# Patient Record
Sex: Male | Born: 1955 | Race: White | Hispanic: No | Marital: Married | Smoking: Former smoker
Health system: Southern US, Community
[De-identification: ages and names within clinical notes are randomized; demographics above are authoritative.]

## PROBLEM LIST (undated history)

## (undated) DIAGNOSIS — K4090 Unilateral inguinal hernia, without obstruction or gangrene, not specified as recurrent: Secondary | ICD-10-CM

## (undated) DIAGNOSIS — R972 Elevated prostate specific antigen [PSA]: Secondary | ICD-10-CM

## (undated) DIAGNOSIS — A6002 Herpesviral infection of other male genital organs: Secondary | ICD-10-CM

## (undated) DIAGNOSIS — N433 Hydrocele, unspecified: Secondary | ICD-10-CM

## (undated) DIAGNOSIS — F32A Depression, unspecified: Secondary | ICD-10-CM

## (undated) HISTORY — PX: PROSTATE BIOPSY: SHX241

## (undated) HISTORY — PX: NO PAST SURGERIES: SHX2092

## (undated) HISTORY — PX: OTHER SURGICAL HISTORY: SHX169

## (undated) HISTORY — DX: Herpesviral infection of other male genital organs: A60.02

---

## 2015-07-15 ENCOUNTER — Ambulatory Visit (INDEPENDENT_AMBULATORY_CARE_PROVIDER_SITE_OTHER): Payer: BLUE CROSS/BLUE SHIELD | Admitting: Urology

## 2015-07-15 ENCOUNTER — Telehealth: Payer: Self-pay | Admitting: Surgery

## 2015-07-15 ENCOUNTER — Encounter: Payer: Self-pay | Admitting: Urology

## 2015-07-15 VITALS — BP 133/77 | HR 79 | Ht 72.0 in | Wt 205.0 lb

## 2015-07-15 DIAGNOSIS — K4090 Unilateral inguinal hernia, without obstruction or gangrene, not specified as recurrent: Secondary | ICD-10-CM | POA: Diagnosis not present

## 2015-07-15 DIAGNOSIS — N50819 Testicular pain, unspecified: Secondary | ICD-10-CM

## 2015-07-15 DIAGNOSIS — N4340 Spermatocele of epididymis, unspecified: Secondary | ICD-10-CM

## 2015-07-15 NOTE — Progress Notes (Signed)
07/15/2015 2:16 PM   Dan Li June 20, 1955 [id number]  Referring provider: Rusty Aus, MD [facility] [place], [place] [address]  Chief Complaint  Patient presents with  . Testicle Pain    New Patient    HPI: 60 year old male referred for further evaluation of right testicular discomfort and enlargement. The patient reports that he is had a long history of right scrotal enlargement which may have gotten larger over the past year.  This was noted by his PCP on physical exam and referred for further evaluation. He denies any severe testicular or scrotal pain rather a dull aching in this area at times.   His primary complaint is right groin pain which bothers him on occasion for a day or 2 at a time and then resolves. This tends to be one is more active or lifting. 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  He does also have some chronic mid back pain for which she takes Advil.  He denies any testicular trauma or groin injuries. No testicular masses.  No weight loss or night sweats.  Denies any issues voiding. No gross hematuria, dysuria, or UTIs.  No scrotal or pelvic imaging.   PMH: Past Medical History  Diagnosis Date  . [name]     Surgical History: Past Surgical History  Procedure Laterality Date  . None      Home Medications:    Medication List    Notice  As of 07/15/2015 11:59 PM   You have not been prescribed any medications.      Allergies:  Allergies  Allergen Reactions  . Thorazine [Chlorpromazine]     tongue swelling    Family History: Family History  Problem Relation Age of Onset  . Bladder Cancer Neg Hx   . Prostate cancer Neg Hx   . Kidney cancer Neg Hx     Social History:  reports that he has quit smoking. He does not have any smokeless tobacco history on file. He reports that he drinks alcohol. He reports that he does not use illicit  drugs.  ROS: UROLOGY Frequent Urination?: No Hard to postpone urination?: No Burning/pain with urination?: No Get up at night to urinate?: No Leakage of urine?: No Urine stream starts and stops?: No Trouble starting stream?: No Do you have to strain to urinate?: No Blood in urine?: No Urinary tract infection?: No Sexually transmitted disease?: Yes Injury to kidneys or bladder?: No Painful intercourse?: No Weak stream?: No Erection problems?: No Penile pain?: No  Gastrointestinal Nausea?: No Vomiting?: No Indigestion/heartburn?: No Diarrhea?: No Constipation?: No  Constitutional Fever: No Night sweats?: No Weight loss?: No Fatigue?: No  Skin Skin rash/lesions?: No Itching?: No  Eyes Blurred vision?: No Double vision?: No  Ears/Nose/Throat Sore throat?: No Sinus problems?: No  Hematologic/Lymphatic Swollen glands?: No Easy bruising?: No  Cardiovascular Leg swelling?: No Chest pain?: No  Respiratory Cough?: No Shortness of breath?: No  Endocrine Excessive thirst?: No  Musculoskeletal Back pain?: Yes Joint pain?: No  Neurological Headaches?: No Dizziness?: No  Psychologic Depression?: No Anxiety?: No  Physical Exam: BP 133/77 mmHg  Pulse 79  Ht 6' (1.829 m)  Wt 205 lb (92.987 kg)  BMI 27.80 kg/m2  Constitutional:  Alert and oriented, No acute distress. HEENT: [place] AT, moist mucus membranes.  Trachea midline, no masses. Cardiovascular: No clubbing, cyanosis, or edema. Respiratory: Normal respiratory effort, no increased work of breathing. GI: Abdomen  is soft, nontender, nondistended, no abdominal masses.  Bulging in the right inguinal area and noted with Valsalva consistent with possible right inguinal hernia. This is not palpable on the left side and markedly different. GU: No CVA tenderness.  Normal orthotopic urethral meatus which is patent, normal phallus. Bilateral testicles nontender without masses. Right approximately 3 cm  spermatocele palpable just above the right testicle but distinctly separate.  No cord tenderness bilaterally. Mild fullness of right scrotum consistent with very small hydrocele. No scrotal swelling or erythema. Skin: No rashes, bruises or suspicious lesions. Lymph: No cervical or inguinal adenopathy. Neurologic: Grossly intact, no focal deficits, moving all 4 extremities. Psychiatric: Normal mood and affect.  Laboratory Data: Comprehensive Metabolic Panel (CMP) (73/71/0626 9:51 AM) Comprehensive Metabolic Panel (CMP) (94/85/4627 9:51 AM)  Component Value Ref Range  Glucose 97 70 - 110 mg/dL  Sodium 142 136 - 145 mmol/L  Potassium 4.1 3.6 - 5.1 mmol/L  Chloride 106 97 - 109 mmol/L  Carbon Dioxide (CO2) 27.9 22.0 - 32.0 mmol/L  Urea Nitrogen (BUN) 18 7 - 25 mg/dL  Creatinine 1.3 0.7 - 1.3 mg/dL  Glomerular Filtration Rate (eGFR), MDRD Estimate 56 (L) >60 mL/min/1.73sq m   PSA, Total (Screen) (07/06/2015 9:51 AM) PSA, Total (Screen) (07/06/2015 9:51 AM)  Component Value Ref Range  PSA (Prostate Specific Antigen), Total 0.97 0.10 - 4.00 ng/mL   CBC w/auto Differential (5 Part) (07/06/2015 9:51 AM) CBC w/auto Differential (5 Part) (07/06/2015 9:51 AM)  Component Value Ref Range  WBC (White Blood Cell Count) 3.9 (L) 4.1 - 10.2 10^3/uL  RBC (Red Blood Cell Count) 5.28 4.69 - 6.13 10^6/uL  Hemoglobin 14.6 14.1 - 18.1 gm/dL  Hematocrit 43.7 40.0 - 52.0 %  MCV (Mean Corpuscular Volume) 82.8 80.0 - 100.0 fl  MCH (Mean Corpuscular Hemoglobin) 27.7 27.0 - 31.2 pg  MCHC (Mean Corpuscular Hemoglobin Concentration) 33.4 32.0 - 36.0 gm/dL  Platelet Count 135 (L) 150 - 450 10^3/uL     Urinalysis Urinalysis w/Microscopic (07/06/2015 9:51 AM) Urinalysis w/Microscopic (07/06/2015 9:51 AM)  Component Value Ref Range  Color Yellow Yellow, Straw  Clarity Clear Clear  Specific Gravity 1.010 1.000 - 1.030  pH, Urine 6.5 5.0 - 8.0  Protein, Urinalysis Negative Negative, Trace mg/dL  Glucose,  Urinalysis Negative Negative mg/dL  Ketones, Urinalysis Negative Negative mg/dL  Blood, Urinalysis Negative Negative  Nitrite, Urinalysis Negative Negative  Leukocyte Esterase, Urinalysis Negative Negative  White Blood Cells, Urinalysis None Seen None Seen, 0-3 /hpf  Red Blood Cells, Urinalysis None Seen None Seen, 0-3 /hpf  Bacteria, Urinalysis None Seen None Seen /hpf  Squamous Epithelial Cells, Urinalysis None Seen Rare, Few, None Seen /hpf    Pertinent Imaging: n/a*  Assessment & Plan:   1. Testicle pain Moderate sized right spermatocele noted on exam today. Will obtain scrotal ultrasound to confirm this diagnosis.  I explained that this is benign and does not necessarily need surgical excision unless it bothering him.  I offered him surgical excision of his right spermatocele and discussed the procedure itself today at length. He may also have a right inguinal hernia and if he will be having surgery for this, I explained that I am happy to arrange for a combined procedure. All of his questions were answered. - Urinalysis, Complete - US Scrotum; Future - [place] Art/Ven Flow Abd Pelv Doppler; Future   2. Right inguinal hernia Suspect right inguinal hernia palpable today on exam. I will refer him to general surgery for further evaluation. - Ambulatory referral to  General Surgery  3. Spermatocele As above  Return if symptoms worsen or fail to improve, for will call with scrotal ultrasound result --> possible surgery.  Hollice Espy, MD  [facility] [address], [place], [place] [address] [phone]

## 2015-07-15 NOTE — Telephone Encounter (Signed)
I have called patient to make an appointment with general surgery for a right inguinal hernia. No answer. I have left a message on voicemail for patient to call us back to make an appointment.

## 2015-07-16 LAB — URINALYSIS, COMPLETE
BILIRUBIN UA: NEGATIVE
GLUCOSE, UA: NEGATIVE
KETONES UA: NEGATIVE
Leukocytes, UA: NEGATIVE
NITRITE UA: NEGATIVE
Protein, UA: NEGATIVE
SPEC GRAV UA: 1.02 (ref 1.005–1.030)
UUROB: 0.2 mg/dL (ref 0.2–1.0)
pH, UA: 7 (ref 5.0–7.5)

## 2015-07-16 LAB — MICROSCOPIC EXAMINATION
Bacteria, UA: NONE SEEN
Epithelial Cells (non renal): NONE SEEN /hpf (ref 0–10)

## 2015-07-17 ENCOUNTER — Encounter: Payer: Self-pay | Admitting: Urology

## 2015-07-21 NOTE — Telephone Encounter (Signed)
We are unable to contact patient--i have mailed the patient a letter to contact our office to make appointment.

## 2015-07-25 ENCOUNTER — Ambulatory Visit: Payer: BLUE CROSS/BLUE SHIELD

## 2019-01-13 ENCOUNTER — Ambulatory Visit: Payer: BLUE CROSS/BLUE SHIELD | Admitting: Urology

## 2019-01-20 ENCOUNTER — Encounter: Payer: Self-pay | Admitting: Urology

## 2019-01-20 ENCOUNTER — Other Ambulatory Visit: Payer: Self-pay

## 2019-01-20 ENCOUNTER — Ambulatory Visit: Payer: BLUE CROSS/BLUE SHIELD | Admitting: Urology

## 2019-01-20 DIAGNOSIS — K4090 Unilateral inguinal hernia, without obstruction or gangrene, not specified as recurrent: Secondary | ICD-10-CM | POA: Diagnosis not present

## 2019-01-20 DIAGNOSIS — N4341 Spermatocele of epididymis, single: Secondary | ICD-10-CM

## 2019-01-20 LAB — URINALYSIS, COMPLETE
Bilirubin, UA: NEGATIVE
Glucose, UA: NEGATIVE
Ketones, UA: NEGATIVE
Leukocytes,UA: NEGATIVE
Nitrite, UA: NEGATIVE
Protein,UA: NEGATIVE
Specific Gravity, UA: 1.025 (ref 1.005–1.030)
Urobilinogen, Ur: 0.2 mg/dL (ref 0.2–1.0)
pH, UA: 6.5 (ref 5.0–7.5)

## 2019-01-20 LAB — MICROSCOPIC EXAMINATION
Bacteria, UA: NONE SEEN
Epithelial Cells (non renal): NONE SEEN /hpf (ref 0–10)
WBC, UA: NONE SEEN /hpf (ref 0–5)

## 2019-01-20 NOTE — Progress Notes (Signed)
01/20/2019 11:06 AM   Dan Li 1955/08/01 [id number]  Referring provider: [name], MD [phone] [facility] [address] [facility] [place],  [place] [address]  Chief Complaint  Patient presents with  . Testicle Pain    HPI: 63 year old male with a personal history of a right spermatocele seen and evaluated 3+ years ago he returns today with the same complaint.  At that time, he was having intermittent symptoms from right epididymal cyst/spermatocele based on physical exam findings.  There is also concern for a inguinal hernia for which she was referred to general surgery but never pursued this evaluation.  He was essentially lost to follow-up.  He reports today, over the past several years, he has had some mild enlargement in his right hemiscrotum.  He reports discomfort in his underwear, heaviness in his scrotum which can be bothersome to him at times.  No severe overt pain.  No urinary symptoms.  At the time of previous presentation, he was having inguinal pain and discomfort but reports that he is avoided heavy lifting and thus this pain has almost completely resolved.   PMH: Past Medical History:  Diagnosis Date  . 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 NO PAST SURGERIES      Home Medications:  Allergies as of 01/20/2019      Reactions   Thorazine [chlorpromazine]    tongue swelling      Medication List    as of January 20, 2019 11:06 AM   You have not been prescribed any medications.     Allergies:  Allergies  Allergen Reactions  . Thorazine [Chlorpromazine]     tongue swelling    Family History: Family History  Problem Relation Age of Onset  . Bladder Cancer Neg Hx   . Prostate cancer Neg Hx   . Kidney cancer Neg Hx     Social History:  reports that he has quit smoking. He has never used smokeless tobacco. He reports current alcohol use. He reports that he does  not use drugs.  ROS: UROLOGY Frequent Urination?: No Hard to postpone urination?: No Burning/pain with urination?: No Get up at night to urinate?: No Leakage of urine?: No Urine stream starts and stops?: No Trouble starting stream?: No Do you have to strain to urinate?: No Blood in urine?: No Urinary tract infection?: No Sexually transmitted disease?: No Injury to kidneys or bladder?: No Painful intercourse?: No Weak stream?: No Erection problems?: No Penile pain?: No  Gastrointestinal Nausea?: No Vomiting?: No Indigestion/heartburn?: No Diarrhea?: No Constipation?: No  Constitutional Fever: No Night sweats?: No Weight loss?: No Fatigue?: No  Skin Skin rash/lesions?: No Itching?: No  Eyes Blurred vision?: No Double vision?: No  Ears/Nose/Throat Sore throat?: No Sinus problems?: No  Hematologic/Lymphatic Swollen glands?: No Easy bruising?: No  Cardiovascular Leg swelling?: No Chest pain?: No  Respiratory Cough?: No Shortness of breath?: No  Endocrine Excessive thirst?: No  Musculoskeletal Back pain?: No Joint pain?: No  Neurological Headaches?: No Dizziness?: No  Psychologic Depression?: No Anxiety?: No  Physical Exam: BP (!) 150/82   Pulse 71   Ht 6' (1.829 m)   Wt 200 lb (90.7 kg)   BMI 27.12 kg/m   Constitutional:  Alert and oriented, No acute distress. HEENT: [place] AT, moist mucus membranes.  Trachea midline, no masses. Cardiovascular: No clubbing, cyanosis, or edema. Respiratory: Normal respiratory effort, no increased work of breathing. GI: Abdomen is  soft, nontender, nondistended, no abdominal masses, fullness in right inguinal area with Valsalva GU: Full right hemiscrotum with a barely palpable right testicle, presumably related to small hydrocele, large cystic structure approximately lemon size overlying right testicle which is discrete as well as bulky tissue like feel within the inguinal canal.  Uncircumcised phallus with  easily retractable foreskin.  Left hemiscrotum and testicle normal. Lymph: No cervical or inguinal lymphadenopathy. Skin: No rashes, bruises or suspicious lesions. Neurologic: Grossly intact, no focal deficits, moving all 4 extremities. Psychiatric: Normal mood and affect.  Laboratory Data: nA   Pertinent Imaging: none  Assessment & Plan:    1. Spermatocele of epididymis, single Right scrotal fullness with likely multiple pathologies including likely enlarging right spermatocele, possible small hydrocele, and exam highly suspicious for right inguinal hernia as per previous evaluation.  I recommended a scrotal ultrasound for more clear anatomic evaluation to differentiate structures and pathology.  If he does not fact have a right inguinal hernia, he would likely benefit from repair as he has been symptomatic from this in the past.  Spermatocele versus hydrocelectomy could be performed simultaneously.  He does have bother from this.  Plan to have him follow-up after scrotal ultrasound, will call him with results.  If he needs see general surgery, like him to be seen first and then follow-up with me.  He is agreeable this plan.  He will pursue imaging and follow-up this time as he was previously lost to follow-up. - Urinalysis, Complete  2. Right inguinal hernia As above   Return for WILL CALL WITH SCROTAL ULTRASOUND REPORT.  Hollice Espy, MD  [facility] [address], [place], [place] [address] [phone]

## 2019-01-28 ENCOUNTER — Other Ambulatory Visit: Payer: Self-pay | Admitting: Urology

## 2019-01-28 ENCOUNTER — Ambulatory Visit
Admission: RE | Admit: 2019-01-28 | Discharge: 2019-01-28 | Disposition: A | Discharge status: Home / Self Care | Payer: BLUE CROSS/BLUE SHIELD | Source: Ambulatory Visit | Attending: Urology | Admitting: Urology

## 2019-01-28 ENCOUNTER — Other Ambulatory Visit: Payer: Self-pay

## 2019-01-28 DIAGNOSIS — N4341 Spermatocele of epididymis, single: Secondary | ICD-10-CM | POA: Diagnosis not present

## 2019-02-05 ENCOUNTER — Telehealth: Payer: Self-pay | Admitting: Urology

## 2019-02-05 NOTE — Telephone Encounter (Signed)
Pt called asking about U/S results from 9/30.  He said he was supposed to get a call.

## 2019-02-10 NOTE — Telephone Encounter (Signed)
Spoke with patient-aware Dr. Erlene Quan will call patient to discuss results on 02/12/19-verbalized understanding.

## 2019-02-10 NOTE — Telephone Encounter (Signed)
Pt LMOM and requests a call back with [place] results.

## 2019-02-12 ENCOUNTER — Telehealth: Payer: Self-pay | Admitting: Urology

## 2019-02-12 DIAGNOSIS — K4090 Unilateral inguinal hernia, without obstruction or gangrene, not specified as recurrent: Secondary | ICD-10-CM

## 2019-02-12 NOTE — Telephone Encounter (Signed)
Called patient with scrotal ultrasound results.  He has a large amount of fluid in his scrotum was difficult to assess whether or not this is related to hydrocele or spermatocele.  Clinically, is still have also some concern for inguinal hernia based on his exam and history.  We will plan on pelvic CT scan to rule out inguinal hernia prior to surgery and get general surgery involved as needed.  We discussed tentatively booking him for right spermatocele/hydrocele repair in about a month and adjust procedure as needed based on CT scan results.  Hollice Espy, MD

## 2019-02-18 ENCOUNTER — Other Ambulatory Visit: Payer: Self-pay

## 2019-02-18 ENCOUNTER — Ambulatory Visit
Admission: RE | Admit: 2019-02-18 | Discharge: 2019-02-18 | Disposition: A | Discharge status: Home / Self Care | Payer: BLUE CROSS/BLUE SHIELD | Source: Ambulatory Visit | Attending: Urology | Admitting: Urology

## 2019-02-18 DIAGNOSIS — K4090 Unilateral inguinal hernia, without obstruction or gangrene, not specified as recurrent: Secondary | ICD-10-CM | POA: Insufficient documentation

## 2019-02-18 LAB — POCT I-STAT CREATININE: Creatinine, Ser: 1.2 mg/dL (ref 0.61–1.24)

## 2019-02-18 MED ORDER — IOHEXOL 300 MG/ML  SOLN
100.0000 mL | Freq: Once | INTRAMUSCULAR | Status: AC | PRN
  Administered 2019-02-18: 100 mL via INTRAVENOUS

## 2019-02-19 ENCOUNTER — Telehealth: Payer: Self-pay

## 2019-02-19 ENCOUNTER — Other Ambulatory Visit: Payer: Self-pay | Admitting: Radiology

## 2019-02-19 DIAGNOSIS — N4341 Spermatocele of epididymis, single: Secondary | ICD-10-CM

## 2019-02-19 DIAGNOSIS — N433 Hydrocele, unspecified: Secondary | ICD-10-CM

## 2019-02-19 NOTE — Telephone Encounter (Signed)
Called patient no answer could not leave message mail box was full

## 2019-02-19 NOTE — Telephone Encounter (Signed)
-----   Message from Hollice Espy, MD sent at 02/19/2019  1:54 PM EDT ----- No inguinal hernia exclamation this is good news.  We will plan on moving forward with your surgery to take care of this fluid collection through a scrotal approach.  No need for hernia repair obviously.  I have Amy reach out to him to schedule the procedure.  Hollice Espy, MD

## 2019-02-20 NOTE — Telephone Encounter (Signed)
Patient notified

## 2019-02-25 ENCOUNTER — Encounter
Admission: RE | Admit: 2019-02-25 | Discharge: 2019-02-25 | Disposition: A | Discharge status: Home / Self Care | Payer: BLUE CROSS/BLUE SHIELD | Source: Ambulatory Visit | Attending: Urology | Admitting: Urology

## 2019-02-25 ENCOUNTER — Other Ambulatory Visit: Payer: Self-pay

## 2019-02-25 DIAGNOSIS — Z01818 Encounter for other preprocedural examination: Secondary | ICD-10-CM | POA: Diagnosis not present

## 2019-02-25 NOTE — Patient Instructions (Signed)
Your COVID is scheduled on: Thursday 02/26/2019.  Drive up in front of the [facility] any time 8-10:30 am and remain in your vehicle.  Your procedure is scheduled on: Monday 03/02/2019 Report to Same Day Surgery 2nd floor [facility] Entrance-take elevator on left to 2nd floor.  Check in with surgery information desk.) To find out your arrival time, call [phone] 1:00-3:00 PM on Friday 02/27/2019  Remember: Instructions that are not followed completely may result in serious medical risk, up to and including death, or upon the discretion of your surgeon and anesthesiologist your surgery may need to be rescheduled.    __x__ 1. Do not eat food (including mints, candies, chewing gum) after midnight the night before your procedure. You may drink clear liquids up to 2 hours before you are scheduled to arrive at the hospital for your procedure.  Do not drink anything within 2 hours of your scheduled arrival to the hospital.  Approved clear liquids:  --Water or Apple juice without pulp  --Clear carbohydrate beverage such as Gatorade or Powerade  --Black Coffee or Clear Tea (No milk, no creamers, do not add anything to the coffee or tea)    __x__ 2. No Alcohol for 24 hours before or after surgery.   __x__ 3. No Smoking or e-cigarettes for 24 hours before surgery.  Do not use any chewable tobacco products for at least 6 hours before surgery.   __x__ 4. Notify your doctor if there is any change in your medical condition (cold, fever, infections).   __x__ 5. On the morning of surgery brush your teeth with toothpaste and water.  You may rinse your mouth with mouthwash if you wish.  Do not swallow any toothpaste or mouthwash.  Please read over the following fact sheets that you were given:   [facility] Preparing for Surgery and/or MRSA Information    __x__ Use CHG Soap  as directed on instruction sheet.   Do not wear jewelry on the day of surgery.  Do not wear lotions,  powders, deodorant, or perfumes.   Do not shave below the face/neck 48 hours prior to surgery.   Do not bring valuables to the hospital.    [facility] is not responsible for any belongings or valuables.               Contacts, dentures or bridgework may not be worn into surgery.  For patients discharged on the day of surgery, you will NOT be permitted to drive yourself home.  You must have a responsible adult with you for 24 hours after surgery.  __x__ Take these medicines on the morning of surgery with A SMALL SIP OF WATER:  1. NONE  __x__ Follow recommendations from Cardiologist, Pulmonologist or PCP regarding stopping Aspirin, Coumadin, Plavix, Eliquis, Effient, Pradaxa, and Pletal.  __x__ STARTING TODAY: Do not take any Anti-inflammatories such as Advil, Ibuprofen, Motrin, Aleve, Naproxen, Naprosyn, BC/Goodies powders or aspirin products. You may take Tylenol if needed.   __x__ STARTING TODAY: Do not take any over the counter supplements until after surgery. You may continue to take your Vitamin B.

## 2019-02-26 ENCOUNTER — Other Ambulatory Visit
Admission: RE | Admit: 2019-02-26 | Discharge: 2019-02-26 | Disposition: A | Discharge status: Home / Self Care | Payer: BLUE CROSS/BLUE SHIELD | Source: Ambulatory Visit | Attending: Urology | Admitting: Urology

## 2019-02-26 DIAGNOSIS — Z01812 Encounter for preprocedural laboratory examination: Secondary | ICD-10-CM | POA: Insufficient documentation

## 2019-02-26 DIAGNOSIS — Z20828 Contact with and (suspected) exposure to other viral communicable diseases: Secondary | ICD-10-CM | POA: Diagnosis not present

## 2019-02-26 LAB — SARS CORONAVIRUS 2 (TAT 6-24 HRS): SARS Coronavirus 2: NEGATIVE

## 2019-03-02 ENCOUNTER — Ambulatory Visit: Payer: BLUE CROSS/BLUE SHIELD | Admitting: Anesthesiology

## 2019-03-02 ENCOUNTER — Encounter
Admission: RE | Disposition: A | Discharge status: Home / Self Care | Payer: Self-pay | Source: Home / Self Care | Attending: Urology

## 2019-03-02 ENCOUNTER — Encounter: Payer: Self-pay | Admitting: Anesthesiology

## 2019-03-02 ENCOUNTER — Ambulatory Visit
Admission: RE | Admit: 2019-03-02 | Discharge: 2019-03-02 | Disposition: A | Discharge status: Home / Self Care | Payer: BLUE CROSS/BLUE SHIELD | Attending: Urology | Admitting: Urology

## 2019-03-02 DIAGNOSIS — N433 Hydrocele, unspecified: Secondary | ICD-10-CM | POA: Diagnosis not present

## 2019-03-02 DIAGNOSIS — K4090 Unilateral inguinal hernia, without obstruction or gangrene, not specified as recurrent: Secondary | ICD-10-CM | POA: Insufficient documentation

## 2019-03-02 DIAGNOSIS — N430 Encysted hydrocele: Secondary | ICD-10-CM

## 2019-03-02 DIAGNOSIS — N4341 Spermatocele of epididymis, single: Secondary | ICD-10-CM

## 2019-03-02 DIAGNOSIS — Z87891 Personal history of nicotine dependence: Secondary | ICD-10-CM | POA: Insufficient documentation

## 2019-03-02 HISTORY — PX: HYDROCELE EXCISION: SHX482

## 2019-03-02 SURGERY — HYDROCELECTOMY
Anesthesia: General | Laterality: Right

## 2019-03-02 MED ORDER — FAMOTIDINE 20 MG PO TABS
20.0000 mg | ORAL_TABLET | Freq: Once | ORAL | Status: AC
  Administered 2019-03-02: 12:00:00 20 mg via ORAL

## 2019-03-02 MED ORDER — BUPIVACAINE HCL 0.5 % IJ SOLN
INTRAMUSCULAR | Status: DC | PRN
  Administered 2019-03-02: 10 mL

## 2019-03-02 MED ORDER — HYDROCODONE-ACETAMINOPHEN 5-325 MG PO TABS: 1.0000 | ORAL_TABLET | Freq: Four times a day (QID) | ORAL | 0 refills | Status: DC | PRN

## 2019-03-02 MED ORDER — EPHEDRINE SULFATE 50 MG/ML IJ SOLN
INTRAMUSCULAR | Status: DC | PRN
  Administered 2019-03-02 (×3): 5 mg via INTRAVENOUS

## 2019-03-02 MED ORDER — DEXAMETHASONE SODIUM PHOSPHATE 10 MG/ML IJ SOLN
INTRAMUSCULAR | Status: AC
  Filled 2019-03-02: qty 1

## 2019-03-02 MED ORDER — DOCUSATE SODIUM 100 MG PO CAPS: 100.0000 mg | ORAL_CAPSULE | Freq: Two times a day (BID) | ORAL | 0 refills | Status: DC

## 2019-03-02 MED ORDER — FENTANYL CITRATE (PF) 100 MCG/2ML IJ SOLN
INTRAMUSCULAR | Status: AC
  Filled 2019-03-02: qty 2

## 2019-03-02 MED ORDER — OXYCODONE HCL 5 MG/5ML PO SOLN: 5.0000 mg | Freq: Once | ORAL | Status: AC | PRN

## 2019-03-02 MED ORDER — SEVOFLURANE IN SOLN
RESPIRATORY_TRACT | Status: AC
  Filled 2019-03-02: qty 250

## 2019-03-02 MED ORDER — MIDAZOLAM HCL 2 MG/2ML IJ SOLN
INTRAMUSCULAR | Status: DC | PRN
  Administered 2019-03-02: 2 mg via INTRAVENOUS

## 2019-03-02 MED ORDER — LACTATED RINGERS IV SOLN
INTRAVENOUS | Status: DC
  Administered 2019-03-02: 12:00:00 via INTRAVENOUS

## 2019-03-02 MED ORDER — CEFAZOLIN SODIUM-DEXTROSE 2-4 GM/100ML-% IV SOLN
INTRAVENOUS | Status: AC
  Filled 2019-03-02: qty 100

## 2019-03-02 MED ORDER — ONDANSETRON HCL 4 MG/2ML IJ SOLN
INTRAMUSCULAR | Status: DC | PRN
  Administered 2019-03-02: 4 mg via INTRAVENOUS

## 2019-03-02 MED ORDER — FENTANYL CITRATE (PF) 100 MCG/2ML IJ SOLN
INTRAMUSCULAR | Status: DC | PRN
  Administered 2019-03-02: 50 ug via INTRAVENOUS

## 2019-03-02 MED ORDER — DEXAMETHASONE SODIUM PHOSPHATE 10 MG/ML IJ SOLN
INTRAMUSCULAR | Status: DC | PRN
  Administered 2019-03-02: 10 mg via INTRAVENOUS

## 2019-03-02 MED ORDER — LIDOCAINE HCL (PF) 2 % IJ SOLN
INTRAMUSCULAR | Status: AC
  Filled 2019-03-02: qty 10

## 2019-03-02 MED ORDER — ACETAMINOPHEN 10 MG/ML IV SOLN
INTRAVENOUS | Status: DC | PRN
  Administered 2019-03-02: 1000 mg via INTRAVENOUS

## 2019-03-02 MED ORDER — ROCURONIUM BROMIDE 50 MG/5ML IV SOLN
INTRAVENOUS | Status: AC
  Filled 2019-03-02: qty 1

## 2019-03-02 MED ORDER — FENTANYL CITRATE (PF) 100 MCG/2ML IJ SOLN: 25.0000 ug | INTRAMUSCULAR | Status: DC | PRN

## 2019-03-02 MED ORDER — PROPOFOL 10 MG/ML IV BOLUS
INTRAVENOUS | Status: DC | PRN
  Administered 2019-03-02: 200 mg via INTRAVENOUS

## 2019-03-02 MED ORDER — PROPOFOL 10 MG/ML IV BOLUS
INTRAVENOUS | Status: AC
  Filled 2019-03-02: qty 20

## 2019-03-02 MED ORDER — OXYCODONE HCL 5 MG PO TABS
5.0000 mg | ORAL_TABLET | Freq: Once | ORAL | Status: AC | PRN
  Administered 2019-03-02: 16:00:00 5 mg via ORAL

## 2019-03-02 MED ORDER — ONDANSETRON HCL 4 MG/2ML IJ SOLN
INTRAMUSCULAR | Status: AC
  Filled 2019-03-02: qty 2

## 2019-03-02 MED ORDER — OXYCODONE HCL 5 MG PO TABS
ORAL_TABLET | ORAL | Status: AC
  Administered 2019-03-02: 5 mg via ORAL
  Filled 2019-03-02: qty 1

## 2019-03-02 MED ORDER — FAMOTIDINE 20 MG PO TABS
ORAL_TABLET | ORAL | Status: AC
  Administered 2019-03-02: 12:00:00 20 mg via ORAL
  Filled 2019-03-02: qty 1

## 2019-03-02 MED ORDER — MIDAZOLAM HCL 2 MG/2ML IJ SOLN
INTRAMUSCULAR | Status: AC
  Filled 2019-03-02: qty 2

## 2019-03-02 MED ORDER — LIDOCAINE HCL (CARDIAC) PF 100 MG/5ML IV SOSY
PREFILLED_SYRINGE | INTRAVENOUS | Status: DC | PRN
  Administered 2019-03-02: 100 mg via INTRAVENOUS

## 2019-03-02 MED ORDER — CEFAZOLIN SODIUM-DEXTROSE 2-4 GM/100ML-% IV SOLN
2.0000 g | INTRAVENOUS | Status: AC
  Administered 2019-03-02: 2 g via INTRAVENOUS

## 2019-03-02 SURGICAL SUPPLY — 42 items
BLADE CLIPPER SURG (BLADE) ×2 IMPLANT
BLADE SURG 15 STRL LF DISP TIS (BLADE) ×1 IMPLANT
BLADE SURG 15 STRL SS (BLADE) ×1
CANISTER SUCT 1200ML W/VALVE (MISCELLANEOUS) ×2 IMPLANT
CHLORAPREP W/TINT 26 (MISCELLANEOUS) ×2 IMPLANT
COVER WAND RF STERILE (DRAPES) ×2 IMPLANT
DERMABOND ADVANCED (GAUZE/BANDAGES/DRESSINGS) ×1
DERMABOND ADVANCED .7 DNX12 (GAUZE/BANDAGES/DRESSINGS) ×1 IMPLANT
DRAIN PENROSE 1/4X12 LTX STRL (WOUND CARE) IMPLANT
DRAIN PENROSE 5/8X18 LTX STRL (WOUND CARE) ×2 IMPLANT
DRAPE LAPAROTOMY 77X122 PED (DRAPES) ×2 IMPLANT
DRSG GAUZE FLUFF 36X18 (GAUZE/BANDAGES/DRESSINGS) ×2 IMPLANT
ELECT CAUTERY BLADE 6.4 (BLADE) ×2 IMPLANT
ELECT CAUTERY NEEDLE TIP 1.0 (MISCELLANEOUS) ×2
ELECT REM PT RETURN 9FT ADLT (ELECTROSURGICAL) ×2
ELECTRODE CAUTERY NEDL TIP 1.0 (MISCELLANEOUS) ×1 IMPLANT
ELECTRODE REM PT RTRN 9FT ADLT (ELECTROSURGICAL) ×1 IMPLANT
GAUZE SPONGE 4X4 12PLY STRL (GAUZE/BANDAGES/DRESSINGS) ×2 IMPLANT
GLOVE BIO SURGEON STRL SZ 6.5 (GLOVE) ×2 IMPLANT
GOWN STRL REUS W/ TWL LRG LVL3 (GOWN DISPOSABLE) ×2 IMPLANT
GOWN STRL REUS W/TWL LRG LVL3 (GOWN DISPOSABLE) ×2
KIT TURNOVER KIT A (KITS) ×2 IMPLANT
LABEL OR SOLS (LABEL) ×2 IMPLANT
NEEDLE HYPO 25X1 1.5 SAFETY (NEEDLE) ×2 IMPLANT
NS IRRIG 1000ML POUR BTL (IV SOLUTION) ×2 IMPLANT
NS IRRIG 500ML POUR BTL (IV SOLUTION) ×2 IMPLANT
PACK BASIN MINOR ARMC (MISCELLANEOUS) ×2 IMPLANT
SUPPORETR ATHLETIC LG (MISCELLANEOUS) ×1 IMPLANT
SUPPORTER ATHLETIC LG (MISCELLANEOUS) ×2
SUT CHROMIC 3 0 PS 2 (SUTURE) ×2 IMPLANT
SUT CHROMIC 3 0 SH 27 (SUTURE) IMPLANT
SUT ETHILON 3-0 FS-10 30 BLK (SUTURE)
SUT ETHILON NAB PS2 4-0 18IN (SUTURE) IMPLANT
SUT VIC AB 2-0 SH 27 (SUTURE) ×2
SUT VIC AB 2-0 SH 27XBRD (SUTURE) ×2 IMPLANT
SUT VIC AB 3-0 SH 27 (SUTURE)
SUT VIC AB 3-0 SH 27X BRD (SUTURE) IMPLANT
SUT VIC AB 4-0 SH 27 (SUTURE)
SUT VIC AB 4-0 SH 27XANBCTRL (SUTURE) IMPLANT
SUTURE EHLN 3-0 FS-10 30 BLK (SUTURE) IMPLANT
SYR 10ML LL (SYRINGE) ×2 IMPLANT
SYR BULB IRRIG 60ML STRL (SYRINGE) ×2 IMPLANT

## 2019-03-02 NOTE — Anesthesia Procedure Notes (Signed)
Procedure Name: LMA Insertion Date/Time: 03/02/2019 1:35 PM Performed by: Johnna Acosta, CRNA Pre-anesthesia Checklist: Patient identified, Emergency Drugs available, Suction available, Patient being monitored and Timeout performed Patient Re-evaluated:Patient Re-evaluated prior to induction Oxygen Delivery Method: Circle system utilized Preoxygenation: Pre-oxygenation with 100% oxygen Induction Type: IV induction LMA: LMA inserted LMA Size: 5.0 Tube type: Oral Number of attempts: 1 Tube secured with: Tape Dental Injury: Teeth and Oropharynx as per pre-operative assessment

## 2019-03-02 NOTE — H&P (Signed)
01/20/2019  --> updated 03/02/2019 Patient underwent a scrotal ultrasound which shows loculated fluid in the scrotum.  CT scan negative for hernia.  We discussed by telephone the above findings.  Plan for right spermatocele/hydrocelectomy.  Risks and benefits were discussed in detail occluding risk of recurrence approximately 10%, bleeding, infection, damage surrounding structures amongst others.  Postoperative care was also discussed.  All questions answered.  RRR CTAB  Dan Li [birth year] [id number]  Referring provider: [name], MD [phone] [facility] [address] [facility] [place],  [place] [address]     Chief Complaint  Patient presents with  . Testicle Pain    HPI: 63 year old male with a personal history of a right spermatocele seen and evaluated 3+ years ago he returns today with the same complaint.  At that time, he was having intermittent symptoms from right epididymal cyst/spermatocele based on physical exam findings.  There is also concern for a inguinal hernia for which she was referred to general surgery but never pursued this evaluation.  He was essentially lost to follow-up.  He reports today, over the past several years, he has had some mild enlargement in his right hemiscrotum.  He reports discomfort in his underwear, heaviness in his scrotum which can be bothersome to him at times.  No severe overt pain.  No urinary symptoms.  At the time of previous presentation, he was having inguinal pain and discomfort but reports that he is avoided heavy lifting and thus this pain has almost completely resolved.   PMH:     Past Medical History:  Diagnosis Date  . [name]     Surgical History:      Past Surgical History:  Procedure Laterality Date  . NO PAST SURGERIES      Home Medications:       Allergies as of 01/20/2019      Reactions   Thorazine [chlorpromazine]    tongue swelling       Medication List    as of January 20, 2019 11:06 AM   You have not been prescribed any medications.     Allergies:       Allergies  Allergen Reactions  . Thorazine [Chlorpromazine]     tongue swelling    Family History:      Family History  Problem Relation Age of Onset  . Bladder Cancer Neg Hx   . Prostate cancer Neg Hx   . Kidney cancer Neg Hx     Social History:  reports that he has quit smoking. He has never used smokeless tobacco. He reports current alcohol use. He reports that he does not use drugs.  ROS: UROLOGY Frequent Urination?: No Hard to postpone urination?: No Burning/pain with urination?: No Get up at night to urinate?: No Leakage of urine?: No Urine stream starts and stops?: No Trouble starting stream?: No Do you have to strain to urinate?: No Blood in urine?: No Urinary tract infection?: No Sexually transmitted disease?: No Injury to kidneys or bladder?: No Painful intercourse?: No Weak stream?: No Erection problems?: No Penile pain?: No  Gastrointestinal Nausea?: No Vomiting?: No Indigestion/heartburn?: No Diarrhea?: No Constipation?: No  Constitutional Fever: No Night sweats?: No Weight loss?: No Fatigue?: No  Skin Skin rash/lesions?: No Itching?: No  Eyes Blurred vision?: No Double vision?: No  Ears/Nose/Throat Sore throat?: No Sinus problems?: No  Hematologic/Lymphatic Swollen glands?: No Easy bruising?: No  Cardiovascular Leg swelling?: No Chest pain?: No  Respiratory Cough?: No Shortness of breath?:  No  Endocrine Excessive thirst?: No  Musculoskeletal Back pain?: No Joint pain?: No  Neurological Headaches?: No Dizziness?: No  Psychologic Depression?: No Anxiety?: No  Physical Exam: BP (!) 150/82   Pulse 71   Ht 6' (1.829 m)   Wt 200 lb (90.7 kg)   BMI 27.12 kg/m   Constitutional:  Alert and oriented, No acute distress. HEENT: Green AT, moist mucus membranes.   Trachea midline, no masses. Cardiovascular: No clubbing, cyanosis, or edema. Respiratory: Normal respiratory effort, no increased work of breathing. GI: Abdomen is soft, nontender, nondistended, no abdominal masses, fullness in right inguinal area with Valsalva GU: Full right hemiscrotum with a barely palpable right testicle, presumably related to small hydrocele, large cystic structure approximately lemon size overlying right testicle which is discrete as well as bulky tissue like feel within the inguinal canal.  Uncircumcised phallus with easily retractable foreskin.  Left hemiscrotum and testicle normal. Lymph: No cervical or inguinal lymphadenopathy. Skin: No rashes, bruises or suspicious lesions. Neurologic: Grossly intact, no focal deficits, moving all 4 extremities. Psychiatric: Normal mood and affect.  Laboratory Data: nA   Pertinent Imaging: none  Assessment & Plan:    1. Spermatocele of epididymis, single Right scrotal fullness with likely multiple pathologies including likely enlarging right spermatocele, possible small hydrocele, and exam highly suspicious for right inguinal hernia as per previous evaluation.  I recommended a scrotal ultrasound for more clear anatomic evaluation to differentiate structures and pathology.  If he does not fact have a right inguinal hernia, he would likely benefit from repair as he has been symptomatic from this in the past.  Spermatocele versus hydrocelectomy could be performed simultaneously.  He does have bother from this.  Plan to have him follow-up after scrotal ultrasound, will call him with results.  If he needs see general surgery, like him to be seen first and then follow-up with me.  He is agreeable this plan.  He will pursue imaging and follow-up this time as he was previously lost to follow-up. - Urinalysis, Complete  2. Right inguinal hernia As above   Return for WILL CALL WITH SCROTAL ULTRASOUND REPORT.  Hollice Espy, MD  [facility] [address] [place] [employer], [place] [address] [phone]

## 2019-03-02 NOTE — Discharge Instructions (Addendum)
Hydrocelectomy, Adult  A hydrocelectomy is a surgical procedure to remove a collection of fluid (hydrocele) from the pouch that holds the testicles (scrotum). You may need to have a hydrocelectomy if a hydrocele is making your scrotum swell painfully. Tell a health care provider about:  Any allergies you have.  All medicines you are taking, including vitamins, herbs, eye drops, creams, and over-the-counter medicines.  Any problems you or family members have had with anesthetic medicines.  Any blood disorders you have.  Any surgeries you have had.  Any medical conditions you have. What are the risks? Generally this is a safe procedure. However, problems may occur, including:  Bleeding into the scrotum (scrotal hematoma).  Damage to the testicle or the tube that carries sperm out of the testicle (vas deferens).  Infection.  Allergic reactions to medicines. What happens before the procedure? Staying hydrated Follow instructions from your health care provider about hydration, which may include:  Up to 2 hours before the procedure - you may continue to drink clear liquids, such as water, clear fruit juice, black coffee, and plain tea. Eating and drinking restrictions Follow instructions from your health care provider about eating and drinking, which may include:  8 hours before the procedure - stop eating heavy meals or foods such as meat, fried foods, or fatty foods.  6 hours before the procedure - stop eating light meals or foods, such as toast or cereal.  6 hours before the procedure - stop drinking milk or drinks that contain milk.  2 hours before the procedure - stop drinking clear liquids. Medicines  Ask your health care provider about: ? Changing or stopping your regular medicines. This is especially important if you are taking diabetes medicines or blood thinners. ? Taking medicines such as aspirin and ibuprofen. These medicines can thin your blood. Do not take these  medicines before your procedure if your health care provider instructs you not to.  You may be given antibiotic medicine to help prevent infection. General instructions  Plan to have someone take you home after the procedure. What happens during the procedure?  To reduce your risk of infection: ? Your health care team will wash or sanitize their hands. ? A germ-killing solution (antiseptic) will be used to wash your scrotum and the area around it. Hair may be removed from this area.  An IV tube will be inserted into one of your veins.  You will be given one or more of the following: ? A medicine to make you relax (sedative). ? A medicine to make you fall asleep (general anesthetic).  A small incision will be made through the skin of your scrotum.  Your testicle and the hydrocele will be located, and the hydrocele sac will be opened with an incision.  The fluid will be drained from the hydrocele.  The hydrocele will be closed with absorbable stitches (sutures) to prevent fluid from building up again.  If you have a large hydrocele, a thin rubber drain may be placed to allow fluid to drain after the procedure.  The incision in your scrotum will be closed with absorbable sutures.  A bandage (dressing) will be placed over the incision. The procedure may vary among health care providers and hospitals. What happens after the procedure?  Your blood pressure, heart rate, breathing rate, and blood oxygen level will be monitored until the medicines you were given have worn off.  You will be given pain medicine as needed.  Do not drive for 24 hours  if you were given a sedative.  You may have to wear an athletic support strap to hold the dressing in place and support your scrotum. This information is not intended to replace advice given to you by your health care provider. Make sure you discuss any questions you have with your health care provider. Document Released: 01/05/2015 Document  Revised: 03/29/2017 Document Reviewed: 01/14/2016 Elsevier Patient Education  2020 [employer]   AMBULATORY SURGERY  DISCHARGE INSTRUCTIONS   1) The drugs that you were given will stay in your system until tomorrow so for the next 24 hours you should not:  A) Drive an automobile B) Make any legal decisions C) Drink any alcoholic beverage   2) You may resume regular meals tomorrow.  Today it is better to start with liquids and gradually work up to solid foods.  You may eat anything you prefer, but it is better to start with liquids, then soup and crackers, and gradually work up to solid foods.   3) Please notify your doctor immediately if you have any unusual bleeding, trouble breathing, redness and pain at the surgery site, drainage, fever, or pain not relieved by medication.    4) Additional Instructions:        Please contact your physician with any problems or Same Day Surgery at [phone], Monday through Friday 6 am to 4 pm, or [place] at [facility] number at [phone].

## 2019-03-02 NOTE — Transfer of Care (Signed)
Immediate Anesthesia Transfer of Care Note  Patient: Dan Li  Procedure(s) Performed: HYDROCELECTOMY ADULT (Right )  Patient Location: PACU  Anesthesia Type:General  Level of Consciousness: awake and alert   Airway & Oxygen Therapy: Patient Spontanous Breathing and Patient connected to face mask oxygen  Post-op Assessment: Report given to RN and Post -op Vital signs reviewed and stable  Post vital signs: Reviewed and stable  Last Vitals:  Vitals Value Taken Time  BP 125/79 03/02/19 1551  Temp 36.1 C 03/02/19 1551  Pulse 56 03/02/19 1553  Resp 17 03/02/19 1553  SpO2 100 % 03/02/19 1553  Vitals shown include unvalidated device data.  Last Pain:  Vitals:   03/02/19 1551  TempSrc:   PainSc: 4          Complications: No apparent anesthesia complications

## 2019-03-02 NOTE — Anesthesia Preprocedure Evaluation (Signed)
Anesthesia Evaluation  Patient identified by MRN, date of birth, ID band Patient awake    Reviewed: Allergy & Precautions, H&P , NPO status , Patient's Chart, lab work & pertinent test results  History of Anesthesia Complications Negative for: history of anesthetic complications  Airway Mallampati: III  TM Distance: <3 FB Neck ROM: limited    Dental  (+) Chipped, Poor Dentition, Missing   Pulmonary neg shortness of breath, former smoker,           Cardiovascular Exercise Tolerance: Good (-) angina(-) Past MI and (-) DOE negative cardio ROS       Neuro/Psych negative neurological ROS  negative psych ROS   GI/Hepatic negative GI ROS, Neg liver ROS, neg GERD  ,  Endo/Other  negative endocrine ROS  Renal/GU      Musculoskeletal   Abdominal   Peds  Hematology negative hematology ROS (+)   Anesthesia Other Findings Past Medical History: No date: [name]  Past Surgical History: No date: NO PAST SURGERIES     Reproductive/Obstetrics negative OB ROS                             Anesthesia Physical Anesthesia Plan  ASA: II  Anesthesia Plan: General LMA   Post-op Pain Management:    Induction: Intravenous  PONV Risk Score and Plan: Dexamethasone, Ondansetron, Midazolam and Treatment may vary due to age or medical condition  Airway Management Planned: LMA  Additional Equipment:   Intra-op Plan:   Post-operative Plan: Extubation in OR  Informed Consent: I have reviewed the patients History and Physical, chart, labs and discussed the procedure including the risks, benefits and alternatives for the proposed anesthesia with the patient or authorized representative who has indicated his/her understanding and acceptance.     Dental Advisory Given  Plan Discussed with: Anesthesiologist, CRNA and Surgeon  Anesthesia Plan Comments: (Patient consented for risks of  anesthesia including but not limited to:  - adverse reactions to medications - damage to teeth, lips or other oral mucosa - sore throat or hoarseness - Damage to heart, brain, lungs or loss of life  Patient voiced understanding.)        Anesthesia Quick Evaluation

## 2019-03-02 NOTE — Anesthesia Post-op Follow-up Note (Signed)
Anesthesia QCDR form completed.        

## 2019-03-02 NOTE — Op Note (Signed)
Date of procedure: 03/02/19  Preoperative diagnosis:  1. Right hydrocele versus spermatocele  Postoperative diagnosis:  1. Right hydrocele  Procedure: 1. Right hydrocelectomy  Surgeon: Hollice Espy, MD  Anesthesia: General  Complications: None  Intraoperative findings: 200 cc straw-colored fluid surrounding the right testicle within the hydrocele sac, smaller approximately 15 cc loculated collection adjacent to this.  EBL: Minimal  Specimens: Right hydrocele sac  Drains: None  Indication: Dan Li is a 63 y.o. patient with large right scrotal fluid collection representing either a spermatocele versus hydrocele without communicating hernia.  After reviewing the management options for treatment, he elected to proceed with the above surgical procedure(s). We have discussed the potential benefits and risks of the procedure, side effects of the proposed treatment, the likelihood of the patient achieving the goals of the procedure, and any potential problems that might occur during the procedure or recuperation. Informed consent has been obtained.  Description of procedure:  The patient was taken to the operating room and general anesthesia was induced.  The patient was placed in the supine position, prepped and draped in the usual sterile fashion, and preoperative antibiotics were administered. A preoperative time-out was performed.   In approximately 4 cm incision was created along the lines transversely across the right hemiscrotum.  This was carried down through the subcutaneous layers using Bovie electrocautery.  The hydrocele sac was entered bluntly dissected and delivered.  The wound.  Additional blunt dissection freed a few additional overlying layers and cremasterics fibers.  On inspection, this clearly represented a hydrocele rather than a spermatocele.  The incision was then created in the sac in 20 cc of straw-colored fluid was drained.  The hydrocele sac was then opened  widely and the edges were excised on either side with care taken to avoid injury to the testicle for the cord structures.  A second smaller sac containing approximately 15 cc of fluid was also drained, representing likely a second loculation of the hydrocele sac.  Its edges were also excised in continuity with the previous.  The wound was closely irrigated.  The remaining sac edges were then everted and oversewn up to the level of the cord using a 2-0 Vicryl for hemostasis.  This was left open ended in order to avoid strangulation to the cord structures itself.  Careful and adequate hemostasis was then achieved using Bovie electrocautery.  The testicle was then replaced back into the right hemiscrotum in normal anatomic position with the lateral sulcus of the epididymis placed laterally.  The wound edges were instilled with half percent lidocaine, 10 cc from nipple anesthetic.  Dartos was then closed using a running 2-0 Vicryl suture.  The skin was closed using interrupted 4-0 chromic suture.  The patient was admitted dried.  Additional Dermabond was placed on the bed.  Scrotal fluffs and scrotal support devices were applied.  The patient was then repositioned with our spine side, extubated, and taken to the PACU in stable condition after anesthesia was adequately reversed.  Plan: He will follow up clinic in 4 weeks for weight check.  Reviewed postoperative instructions with patient and significant other.  Hollice Espy, M.D.

## 2019-03-03 ENCOUNTER — Encounter: Payer: Self-pay | Admitting: Urology

## 2019-03-03 NOTE — Anesthesia Postprocedure Evaluation (Signed)
Anesthesia Post Note  Patient: Dan Li  Procedure(s) Performed: HYDROCELECTOMY ADULT (Right )  Patient location during evaluation: PACU Anesthesia Type: General Level of consciousness: awake and alert Pain management: pain level controlled Vital Signs Assessment: post-procedure vital signs reviewed and stable Respiratory status: spontaneous breathing, nonlabored ventilation, respiratory function stable and patient connected to nasal cannula oxygen Cardiovascular status: blood pressure returned to baseline and stable Postop Assessment: no apparent nausea or vomiting Anesthetic complications: no     Last Vitals:  Vitals:   03/02/19 1602 03/02/19 1629  BP: 127/73 121/73  Pulse: 62 63  Resp: 16 16  Temp:    SpO2: 100% 100%    Last Pain:  Vitals:   03/02/19 1629  TempSrc:   PainSc: 2                  Precious Haws Tiawanna Luchsinger

## 2019-03-04 LAB — SURGICAL PATHOLOGY

## 2019-03-11 ENCOUNTER — Encounter: Payer: Self-pay | Admitting: Physician Assistant

## 2019-03-11 ENCOUNTER — Ambulatory Visit (INDEPENDENT_AMBULATORY_CARE_PROVIDER_SITE_OTHER): Payer: BLUE CROSS/BLUE SHIELD | Admitting: Urology

## 2019-03-11 ENCOUNTER — Other Ambulatory Visit: Payer: Self-pay

## 2019-03-11 VITALS — BP 126/70 | HR 72 | Ht 72.0 in | Wt 198.0 lb

## 2019-03-11 DIAGNOSIS — N433 Hydrocele, unspecified: Secondary | ICD-10-CM

## 2019-03-11 NOTE — Progress Notes (Signed)
03/11/2019 4:01 PM   Dan Li December 05, 1955 [id number]  CC: Scrotal swelling and tenderness  HPI: Dan Li is a 63 y.o. male who presents today for evaluation of scrotal swelling and tenderness. He is POD 9 from right hydrocelectomy with Dr. Apolinar Junes.  He states that the week after his hydrocelectomy he was feeling well so he returned to work full-time and had intercourse with his wife Monday evening.  He states the next day he felt a tightness in his right scrotum and when he looked down he noticed it was swollen.  He is fearful that he may have injured the right scrotum or he is one of the individuals for which the hydrocele will return.  He denied any drainage from the incision, severe pain in the scrotum, fever/chills and/or nausea or vomiting.  PMH: Past Medical History:  Diagnosis Date  . 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 HYDROCELE EXCISION Right 03/02/2019   Procedure: HYDROCELECTOMY ADULT;  Surgeon: Vanna Scotland, MD;  Location: [facility] ORS;  Service: Urology;  Laterality: Right;  . NO PAST SURGERIES      Home Medications:  Allergies as of 03/11/2019      Reactions   Thorazine [chlorpromazine] Anaphylaxis      Medication List       Accurate as of March 11, 2019  4:01 PM. If you have any questions, ask your nurse or doctor.        docusate sodium 100 MG capsule Commonly known as: COLACE Take 1 capsule (100 mg total) by mouth 2 (two) times daily.   HYDROcodone-acetaminophen 5-325 MG tablet Commonly known as: NORCO/VICODIN Take 1-2 tablets by mouth every 6 (six) hours as needed for moderate pain.   Vitamin B-12 1000 MCG/15ML Liqd Take 15 mLs by mouth 3 (three) times a week.       Allergies:  Allergies  Allergen Reactions  . Thorazine [Chlorpromazine] Anaphylaxis    Family History: Family History  Problem Relation Age of Onset  . Bladder Cancer Neg Hx   . Prostate cancer Neg  Hx   . Kidney cancer Neg Hx     Social History:   reports that he has quit smoking. He has never used smokeless tobacco. He reports current alcohol use. He reports that he does not use drugs.  ROS: UROLOGY Frequent Urination?: No Hard to postpone urination?: No Burning/pain with urination?: No Get up at night to urinate?: No Leakage of urine?: No Urine stream starts and stops?: No Trouble starting stream?: No Do you have to strain to urinate?: No Blood in urine?: No Urinary tract infection?: No Sexually transmitted disease?: No Injury to kidneys or bladder?: No Painful intercourse?: No Weak stream?: No Erection problems?: No Penile pain?: No  Gastrointestinal Nausea?: No Vomiting?: No Indigestion/heartburn?: No Diarrhea?: No Constipation?: No  Constitutional Fever: No Night sweats?: No Weight loss?: No Fatigue?: No  Skin Skin rash/lesions?: No Itching?: No  Eyes Blurred vision?: No Double vision?: No  Ears/Nose/Throat Sore throat?: No Sinus problems?: No  Hematologic/Lymphatic Swollen glands?: No Easy bruising?: No  Cardiovascular Leg swelling?: No Chest pain?: No  Respiratory Cough?: No Shortness of breath?: No  Endocrine Excessive thirst?: No  Musculoskeletal Back pain?: No Joint pain?: No  Neurological Headaches?: No Dizziness?: No  Psychologic Depression?: No Anxiety?: No  Physical Exam: BP 126/70   Pulse 72   Ht 6' (1.829 m)   Wt 198 lb (89.8 kg)   BMI  26.85 kg/m   Constitutional:  Well nourished. Alert and oriented, No acute distress. HEENT: [place] AT, moist mucus membranes.  Trachea midline, no masses. Cardiovascular: No clubbing, cyanosis, or edema. Respiratory: Normal respiratory effort, no increased work of breathing. GI: Abdomen is soft, non tender, non distended, no abdominal masses. Liver and spleen not palpable.  No hernias appreciated.  Stool sample for occult testing is not indicated.   GU: No CVA tenderness.  No  bladder fullness or masses.  Patient with uncircumcised phallus.  Foreskin easily retracted Urethral meatus is patent.  No penile discharge. No penile lesions or rashes. Scrotum without lesions, cysts, rashes and/or edema.  Right scrotum is swollen, non -tender, no erythema, crepitus, fluctuance or drainage is noted.  Testicles are located scrotally bilaterally. No masses are appreciated in the testicles. Left and right epididymis Skin: No rashes, bruises or suspicious lesions. Neurologic: Grossly intact, no focal deficits, moving all 4 extremities. Psychiatric: Normal mood and affect.  Laboratory Data: No results found for: WBC, HGB, HCT, MCV, PLT  Lab Results  Component Value Date   CREATININE 1.20 02/18/2019    CrCl cannot be calculated (Patient's most recent lab result is older than the maximum 21 days allowed.).  Results for orders placed or performed during the hospital encounter of 03/02/19  Surgical pathology  Result Value Ref Range   SURGICAL PATHOLOGY      SURGICAL PATHOLOGY CASE: [id number] PATIENT: [place] [facility] Report     Specimen Submitted: A. Hydrocele, right  Clinical History: Right hydrocele/spermatocele    DIAGNOSIS: A. HYDROCELE, RIGHT; EXCISION: - BENIGN FIBROMUSCULAR TISSUE, COMPATIBLE WITH HYDROCELE. - NEGATIVE FOR ATYPIA AND MALIGNANCY.  GROSS DESCRIPTION: A. Labeled: Hydrocele sac- right Received: In formalin Tissue fragment(s): 2 Size: 3.0 x 2.5 x 1.0 cm in aggregate Description: 2 irregular unoriented grossly unremarkable fibromembranous tissue fragments Representatively submitted in 1 cassette.    Final Diagnosis performed by Allena Napoleon, MD.   Electronically signed 03/04/2019 9:20:39AM The electronic signature indicates that the named Attending Pathologist has evaluated the specimen Technical component performed at [facility], [address], [place], [place] [address] Lab: [id number] Dir: Rush Farmer, MD, MMM   Professional component performed at [facility], [facility], [place], [place], [place] [address] Lab: [phone] Dir: Dellia Nims. Reuel Derby, MD      Assessment & Plan:    1.  Right hydrocele S/P right hydrocelectomy on 03/02/2019 with Dr. Erlene Quan Reassured patient that this is normal after hydrocelectomy but to continue to be observant for erythema, purulent drainage, pain, fever/chills and/or nausea/vomiting Encouraged him to continue to wear the supportive undergarments and to limit activities for the next week to allow healing Advised patient to contact the office or schedule a return appointment if he has other concerns and to keep his appointment on December 1 with Dr. Erlene Quan for postop follow-up  Return for Keep 03/31/2019 with Dr. Erlene Quan .  Zara Council, PA-C  [facility] [address], [place], [place] [address] [phone]

## 2019-03-16 ENCOUNTER — Encounter: Payer: Self-pay | Admitting: Physician Assistant

## 2019-03-16 ENCOUNTER — Other Ambulatory Visit: Payer: Self-pay

## 2019-03-16 ENCOUNTER — Ambulatory Visit (INDEPENDENT_AMBULATORY_CARE_PROVIDER_SITE_OTHER): Payer: BLUE CROSS/BLUE SHIELD | Admitting: Physician Assistant

## 2019-03-16 VITALS — BP 123/81 | HR 70 | Ht 72.0 in | Wt 198.0 lb

## 2019-03-16 DIAGNOSIS — N5089 Other specified disorders of the male genital organs: Secondary | ICD-10-CM

## 2019-03-16 NOTE — Patient Instructions (Signed)
Hydrocelectomy, Adult, Care After This sheet gives you information about how to care for yourself after your procedure. Your health care provider may also give you more specific instructions. If you have problems or questions, contact your health care provider. What can I expect after the procedure? After your procedure, it is common to have mild discomfort, swelling, and bruising in the pouch that holds your testicles (scrotum). Follow these instructions at home: Bathing  Ask your health care provider when you can shower, take baths, or go swimming.  If you were told to wear an athletic support strap, take it off when you shower or take a bath. Incision care   Follow instructions from your health care provider about how to take care of your incision. Make sure you: ? Wash your hands with soap and water before you change your bandage (dressing). If soap and water are not available, use hand sanitizer. ? Change your dressing as told by your health care provider. ? Leave stitches (sutures) in place.  Check your incision and scrotum every day for signs of infection. Check for: ? More redness, swelling, or pain. ? Blood or fluid. ? Warmth. ? Pus or a bad smell. Managing pain, stiffness, and swelling  If directed, apply ice to the injured area: ? Put ice in a plastic bag. ? Place a towel between your skin and the bag. ? Leave the ice on for 20 minutes, 2-3 times per day. Driving  Do not drive for 24 hours if you were given a sedative.  Do not drive or use heavy machinery while taking prescription pain medicine.  Ask your health care provider when it is safe to drive. Activity  Do not do any activities that require great strength and energy (are vigorous) for as long as told by your health care provider.  Return to your normal activities as told by your health care provider. Ask your health care provider what activities are safe for you.  Do not lift anything that is heavier than  10 lb (4.5 kg) until your health care provider says that it is safe. General instructions  Take over-the-counter and prescription medicines only as told by your health care provider.  Keep all follow-up visits as told by your health care provider. This is important.  If you were given an athletic support strap, wear it as told by your health care provider.  If you had a drain put in during the procedure, you will need to return for a follow-up visit to have it removed. Contact a health care provider if:  Your pain is not controlled with medicine.  You have more redness or swelling around your scrotum.  You have blood or fluid coming from your scrotum.  Your incision feels warm to the touch.  You have pus or a bad smell coming from your scrotum.  You have a fever. This information is not intended to replace advice given to you by your health care provider. Make sure you discuss any questions you have with your health care provider. Document Released: 01/05/2015 Document Revised: 03/29/2017 Document Reviewed: 01/14/2016 Elsevier Patient Education  2020 [employer].

## 2019-03-16 NOTE — Progress Notes (Signed)
03/16/2019 10:09 AM   Dan Li 04/30/1956 [id number]  CC: Scrotal swelling, wound separation  HPI: Dan Li is a 63 y.o. male who presents today for evaluation of scrotal swelling and wound separation.  He is POD 14 from right hydrocelectomy with Dr. Apolinar Junes.  He was evaluated by Michiel Cowboy on POD 9 for scrotal swelling and tenderness was found to have normal postoperative edema.  Today, he reports his last remaining stitch fell out in the shower this morning.  He notes some skin separation at the site of the stitch.  Additionally, he notes continued right scrotal edema consistent with his prior hydrocelectomy.  He has been wearing his jockstrap every day and has been moderately active.  He denies fever, chills, nausea, vomiting, scrotal pain, and purulent drainage.  PMH: Past Medical History:  Diagnosis Date  . [name]    Surgical History: Past Surgical History:  Procedure Laterality Date  . HYDROCELE EXCISION Right 03/02/2019   Procedure: HYDROCELECTOMY ADULT;  Surgeon: Vanna Scotland, MD;  Location: [facility] ORS;  Service: Urology;  Laterality: Right;  . NO PAST SURGERIES     Home Medications:  Allergies as of 03/16/2019      Reactions   Thorazine [chlorpromazine] Anaphylaxis      Medication List       Accurate as of March 16, 2019 10:09 AM. If you have any questions, ask your nurse or doctor.        docusate sodium 100 MG capsule Commonly known as: COLACE Take 1 capsule (100 mg total) by mouth 2 (two) times daily.   HYDROcodone-acetaminophen 5-325 MG tablet Commonly known as: NORCO/VICODIN Take 1-2 tablets by mouth every 6 (six) hours as needed for moderate pain.   Vitamin B-12 1000 MCG/15ML Liqd Take 15 mLs by mouth 3 (three) times a week.       Allergies:  Allergies  Allergen Reactions  . Thorazine [Chlorpromazine] Anaphylaxis    Family History: Family History  Problem Relation Age of Onset  . Bladder Cancer Neg Hx    . Prostate cancer Neg Hx   . Kidney cancer Neg Hx     Social History:   reports that he has quit smoking. He has never used smokeless tobacco. He reports current alcohol use. He reports that he does not use drugs.  ROS: UROLOGY Frequent Urination?: No Hard to postpone urination?: No Burning/pain with urination?: No Get up at night to urinate?: No Leakage of urine?: No Urine stream starts and stops?: No Trouble starting stream?: No Do you have to strain to urinate?: No Blood in urine?: No Urinary tract infection?: No Sexually transmitted disease?: No Injury to kidneys or bladder?: No Painful intercourse?: No Weak stream?: No Erection problems?: No Penile pain?: No  Gastrointestinal Nausea?: No Vomiting?: No Indigestion/heartburn?: No Diarrhea?: No Constipation?: No  Constitutional Fever: No Night sweats?: No Weight loss?: No Fatigue?: No  Skin Skin rash/lesions?: No Itching?: No  Eyes Blurred vision?: No Double vision?: No  Ears/Nose/Throat Sore throat?: No Sinus problems?: No  Hematologic/Lymphatic Swollen glands?: No Easy bruising?: No  Cardiovascular Leg swelling?: No Chest pain?: No  Respiratory Cough?: No Shortness of breath?: No  Endocrine Excessive thirst?: No  Musculoskeletal Back pain?: No Joint pain?: No  Neurological Headaches?: No Dizziness?: No  Psychologic Depression?: No Anxiety?: No  Physical Exam: BP 123/81   Pulse 70   Ht 6' (1.829 m)   Wt 198 lb (89.8 kg)   BMI 26.85 kg/m   Constitutional:  Alert  and oriented, no acute distress, nontoxic appearing HEENT: Dan Li, AT Cardiovascular: No clubbing, cyanosis, or edema Respiratory: Normal respiratory effort, no increased work of breathing GU: Diffuse edema of the right hemiscrotum without induration, crepitus, fluctuance, erythema, or purulent drainage.  Mild superficial skin separation at the medial and of hydrocelectomy surgical incision, with visible base. Skin: No  rashes, bruises or suspicious lesions Neurologic: Grossly intact, no focal deficits, moving all 4 extremities Psychiatric: Normal mood and affect  Assessment & Plan:   1. Scrotal swelling 63 year old male s/p right hydrocelectomy here with concerns for persistent scrotal edema and wound separation.  Mild, superficial separation of his surgical incision noted, with visible base and without drainage.  I expect this to heal by secondary intent on its own.  No intervention required.   Counseled patient that scrotal edema is very common following hydrocelectomy.  I counseled him that he is not showing any signs of infection or postoperative complications at this time.  I counseled him to treat edema with continued scrotal support via compressive underwear and/or jockstrap, cryotherapy, and NSAIDs.  I advised him to elevate his scrotum and avoid heavy activity.  He expressed understanding.  Return if symptoms worsen or fail to improve.  Debroah Loop, PA-C  [facility] [address] [place], [place] [address] [phone]

## 2019-03-31 ENCOUNTER — Ambulatory Visit: Payer: BLUE CROSS/BLUE SHIELD | Admitting: Urology

## 2019-03-31 ENCOUNTER — Telehealth: Payer: Self-pay | Admitting: Urology

## 2019-03-31 NOTE — Telephone Encounter (Signed)
Spoke with patient and he is having worsening swelling of scrotum and redness with purple areas. Denies fever or puss but says there is some bloody discharge at times. Patient was instructed that we have to wait on COvid test before seeing him at this time

## 2019-03-31 NOTE — Telephone Encounter (Signed)
Pt. States he is having redness,swelling and discharge from incision site. He has an appointment today at 2:00 with Dr. Erlene Quan however he was tested for Covid yesterday by PCP at [facility] and is awaiting results. I spoke with Dr. Erlene Quan who advised to reschedule pt. to 04/01/19 depending on test results. Pt. expressed understanding. Pt. Ask if the test is positive that Dr. Erlene Quan or Clinical Staff would  call him to discuss his symptoms.

## 2019-04-01 ENCOUNTER — Other Ambulatory Visit: Payer: Self-pay

## 2019-04-01 ENCOUNTER — Encounter: Payer: Self-pay | Admitting: Urology

## 2019-04-01 ENCOUNTER — Ambulatory Visit (INDEPENDENT_AMBULATORY_CARE_PROVIDER_SITE_OTHER): Payer: BLUE CROSS/BLUE SHIELD | Admitting: Urology

## 2019-04-01 VITALS — BP 138/82 | HR 86 | Ht 72.0 in | Wt 198.0 lb

## 2019-04-01 DIAGNOSIS — S3022XA Contusion of scrotum and testes, initial encounter: Secondary | ICD-10-CM

## 2019-04-01 DIAGNOSIS — N433 Hydrocele, unspecified: Secondary | ICD-10-CM

## 2019-04-01 NOTE — Progress Notes (Signed)
04/01/2019 5:10 PM   Dan Li 08-Sep-1955 [id number]  Referring provider: [name], MD [phone] [address] [facility] [place],  [place] [address]  Postop  HPI: 63 year old male who returns to the office for postop visit.  He underwent right hydrocelectomy on 03/02/2019 at which time 200 cc was drained from around his right testicle.  Postoperatively, he did initially fine.  He was seen twice in our office on postop day 9 and then again on postop day 14 for some skin separation and scrotal edema.  This was felt to be normal postoperative changes that he was advised for supportive care.  Last week, he developed acute worsening with significant enlargement of his right hemiscrotum, pressure, heaviness along with low-grade temps up to 100.  Since it was a holiday weekend, he contacted his PCP who is also a personal friend on Thanksgiving day and was prescribed empiric Cipro.  He took this for for a total of 3 doses only and then due to concern for cough along with fevers, was prescribed a Z-Pak and switch his antibiotics.  He ultimately ended up being tested for Covid earlier this week which was ultimately negative.  He returns today with ongoing right hemiscrotal fullness, heaviness.  His fevers have subsided.  He has had some reddish drainage from the incision but no purulence.  PMH: Past Medical History:  Diagnosis Date  . 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 HYDROCELE EXCISION Right 03/02/2019   Procedure: HYDROCELECTOMY ADULT;  Surgeon: Vanna Scotland, MD;  Location: [facility] ORS;  Service: Urology;  Laterality: Right;  . NO PAST SURGERIES      Home Medications:  Allergies as of 04/01/2019      Reactions   Thorazine [chlorpromazine] Anaphylaxis      Medication List       Accurate as of April 01, 2019  5:10 PM. If you have any questions, ask your nurse or doctor.        docusate  sodium 100 MG capsule Commonly known as: COLACE Take 1 capsule (100 mg total) by mouth 2 (two) times daily.   HYDROcodone-acetaminophen 5-325 MG tablet Commonly known as: NORCO/VICODIN Take 1-2 tablets by mouth every 6 (six) hours as needed for moderate pain.   Vitamin B-12 1000 MCG/15ML Liqd Take 15 mLs by mouth 3 (three) times a week.       Allergies:  Allergies  Allergen Reactions  . Thorazine [Chlorpromazine] Anaphylaxis    Family History: Family History  Problem Relation Age of Onset  . Bladder Cancer Neg Hx   . Prostate cancer Neg Hx   . Kidney cancer Neg Hx     Social History:  reports that he has quit smoking. He has never used smokeless tobacco. He reports current alcohol use. He reports that he does not use drugs.  ROS: UROLOGY Frequent Urination?: No Hard to postpone urination?: No Burning/pain with urination?: No Get up at night to urinate?: No Leakage of urine?: No Urine stream starts and stops?: No Trouble starting stream?: No Do you have to strain to urinate?: No Blood in urine?: No Urinary tract infection?: No Sexually transmitted disease?: No Injury to kidneys or bladder?: No Painful intercourse?: No Weak stream?: No Erection problems?: No Penile pain?: No  Gastrointestinal Nausea?: No Vomiting?: No Indigestion/heartburn?: No Diarrhea?: No Constipation?: No  Constitutional Fever: No Night sweats?: No Weight loss?: No Fatigue?: No  Skin Skin rash/lesions?: No  Itching?: No  Eyes Blurred vision?: No Double vision?: No  Ears/Nose/Throat Sore throat?: No Sinus problems?: No  Hematologic/Lymphatic Swollen glands?: No Easy bruising?: No  Cardiovascular Leg swelling?: No Chest pain?: No  Respiratory Cough?: No Shortness of breath?: No  Endocrine Excessive thirst?: No  Musculoskeletal Back pain?: No Joint pain?: No  Neurological Headaches?: No Dizziness?: No  Psychologic Depression?: No Anxiety?: No  Physical  Exam: BP 138/82   Pulse 86   Ht 6' (1.829 m)   Wt 198 lb (89.8 kg)   BMI 26.85 kg/m   Constitutional:  Alert and oriented, No acute distress. HEENT: [place] AT, moist mucus membranes.  Trachea midline, no masses. Cardiovascular: No clubbing, cyanosis, or edema. Respiratory: Normal respiratory effort, no increased work of breathing. GI: Abdomen is soft, nontender, nondistended, no abdominal masses GU: Right hemiscrotum without any significant overlying skin changes, tense underlying presumably fluid-filled loculation at which time the testicle is nonpalpable which was almost consistent with his preop exam.  The incision was intact except for a small area in the medial aspect which was probed and unable to probe any deeper than just below the level of the skin.  The incision was somewhat soft and raised and with scrotal manipulation, the lateral aspect of the incision opened approximately half a centimeter at which time a copious amount of diluted clear blood was able to be evacuated along with a few small hematomas.  A total of 200 cc was drained consistent with hematoma/seroma at which time the testicle itself became palpable.  He experienced significant relief of his discomfort at this time.  Quarter inch packing was then placed within the opening which was very loosely packed to keep this area open to allow for any further drainage.  Scrotal support was replaced. Skin: No rashes, bruises or suspicious lesions. Neurologic: Grossly intact, no focal deficits, moving all 4 extremities. Psychiatric: Normal mood and affect.   Assessment & Plan:    1. Hydrocele, unspecified hydrocele type Status post right hydrocelectomy with findings today consistent with postoperative hematoma/seroma which was drained in the office spontaneously   2. Scrotal hematoma/seroma \\See  above  No evidence of infection, fluid drained was nonpurulent with no overlying skin changes   Small wick placed in opening, advised to  pull out about 2 inches per day and cut off leaving a large tail, likely 2 to 3 days of doing this.  Supportive care.  Now that the incision is open, increased risk for infection.  Recommended that he resume the Cipro prescribed by Dr. Sabra Heck for approximately 1 week.  He will call us if he does not have enough doses for a week.   Plan for wound check in 1 week.  Warning symptoms reviewed.   Return for sam on tuesday for wound check.  Hollice Espy, MD  [facility] [address] [place], [place] [address] [phone]

## 2019-04-02 ENCOUNTER — Telehealth: Payer: Self-pay | Admitting: Urology

## 2019-04-02 MED ORDER — CIPROFLOXACIN HCL 500 MG PO TABS: 500.0000 mg | ORAL_TABLET | Freq: Two times a day (BID) | ORAL | 0 refills | Status: DC

## 2019-04-02 NOTE — Addendum Note (Signed)
Addended by: Verlene Mayer A on: 04/02/2019 03:30 PM   Modules accepted: Orders

## 2019-04-02 NOTE — Telephone Encounter (Signed)
Sent in 4 tablets of Cipro-informed patient-requested sent in to [facility] [address]-Patient verbalized understanding.

## 2019-04-02 NOTE — Telephone Encounter (Signed)
Patient called back to let you know that he has 5 1/2 days of Cipro left.   Sharyn Lull

## 2019-04-02 NOTE — Telephone Encounter (Signed)
Can you call in 4 more tablets of 500mg  cipro to make it a week and let him know?

## 2019-04-06 ENCOUNTER — Ambulatory Visit: Payer: BLUE CROSS/BLUE SHIELD | Admitting: Physician Assistant

## 2019-04-06 ENCOUNTER — Other Ambulatory Visit: Payer: Self-pay

## 2019-04-07 ENCOUNTER — Encounter: Payer: Self-pay | Admitting: Physician Assistant

## 2019-04-07 ENCOUNTER — Ambulatory Visit: Payer: BLUE CROSS/BLUE SHIELD | Admitting: Physician Assistant

## 2019-04-07 VITALS — BP 138/76 | HR 88 | Ht 72.0 in | Wt 198.0 lb

## 2019-04-07 DIAGNOSIS — S3022XA Contusion of scrotum and testes, initial encounter: Secondary | ICD-10-CM

## 2019-04-07 NOTE — Patient Instructions (Addendum)
Pull out a length of packing about 2 cm in length every day and snip off the excess, leaving a tail poking out from the incision. Cover the area with gauze or an absorbant pad.  Keep taking your antibiotics as prescribed.  Redness, swelling, pus, fever, chills, and new/worse pain are all warning signs of infection. Call our office if you develop any of these.

## 2019-04-07 NOTE — Progress Notes (Signed)
04/07/2019 10:21 AM   Dan Li [birth year] [id number]  CC: Wound recheck  HPI: Dan Li is a 63 y.o. male who presents today for wound recheck.  He underwent right hydrocelectomy with Dr. Erlene Quan on 03/02/2019 and subsequently experienced right hemiscrotal swelling with skin separation.  And a follow-up visit with Dr. Erlene Quan 1 week ago, his surgical incision was manipulated with drainage of approximately 200 cc of fluid consistent with hematoma versus seroma.  Wound was packed with instructions to remove 2 inches of packing daily and start ciprofloxacin.  He returns for reevaluation today  Today, he reports continued right hemiscrotal edema.  He has had some drainage from the site.  He states he removed the entirety of the previously placed packing and subsequently found that the swelling returned.  He denies fevers and chills, nausea and vomiting.  He states has not been as diligent with his ciprofloxacin, since he was also prescribed azithromycin associated with recent Covid test and he felt this was a lot of antibiotics.  PMH: Past Medical History:  Diagnosis Date   [name]     Surgical History: Past Surgical History:  Procedure Laterality Date   HYDROCELE EXCISION Right 03/02/2019   Procedure: HYDROCELECTOMY ADULT;  Surgeon: Hollice Espy, MD;  Location: [facility] ORS;  Service: Urology;  Laterality: Right;   NO PAST SURGERIES      Home Medications:  Allergies as of 04/07/2019      Reactions   Thorazine [chlorpromazine] Anaphylaxis      Medication List       Accurate as of April 07, 2019 10:21 AM. If you have any questions, ask your nurse or doctor.        STOP taking these medications   docusate sodium 100 MG capsule Commonly known as: COLACE Stopped by: Debroah Loop, PA-C     TAKE these medications   ciprofloxacin 500 MG tablet Commonly known as: CIPRO Take 1 tablet (500 mg total) by mouth 2 (two) times daily.     HYDROcodone-acetaminophen 5-325 MG tablet Commonly known as: NORCO/VICODIN Take 1-2 tablets by mouth every 6 (six) hours as needed for moderate pain.   Vitamin B-12 1000 MCG/15ML Liqd Take 15 mLs by mouth 3 (three) times a week.       Allergies:  Allergies  Allergen Reactions   Thorazine [Chlorpromazine] Anaphylaxis    Family History: Family History  Problem Relation Age of Onset   Bladder Cancer Neg Hx    Prostate cancer Neg Hx    Kidney cancer Neg Hx     Social History:   reports that he has quit smoking. He has never used smokeless tobacco. He reports current alcohol use. He reports that he does not use drugs.  ROS: UROLOGY Frequent Urination?: No Hard to postpone urination?: No Burning/pain with urination?: No Get up at night to urinate?: No Leakage of urine?: No Urine stream starts and stops?: No Trouble starting stream?: No Do you have to strain to urinate?: No Blood in urine?: No Urinary tract infection?: No Sexually transmitted disease?: No Injury to kidneys or bladder?: No Painful intercourse?: No Weak stream?: No Erection problems?: No Penile pain?: No  Gastrointestinal Nausea?: No Vomiting?: No Indigestion/heartburn?: No Diarrhea?: No Constipation?: No  Constitutional Fever: No Night sweats?: No Weight loss?: No Fatigue?: No  Skin Skin rash/lesions?: No Itching?: No  Eyes Blurred vision?: No Double vision?: No  Ears/Nose/Throat Sore throat?: No Sinus problems?: No  Hematologic/Lymphatic Swollen glands?: No Easy bruising?: No  Cardiovascular  Leg swelling?: No Chest pain?: No  Respiratory Cough?: No Shortness of breath?: No  Endocrine Excessive thirst?: No  Musculoskeletal Back pain?: No Joint pain?: No  Neurological Headaches?: No Dizziness?: No  Psychologic Depression?: No Anxiety?: No  Physical Exam: BP 138/76    Pulse 88    Ht 6' (1.829 m)    Wt 198 lb (89.8 kg)    BMI 26.85 kg/m   Constitutional:   Alert and oriented, no acute distress, nontoxic appearing HEENT: [place], AT Cardiovascular: No clubbing, cyanosis, or edema Respiratory: Normal respiratory effort, no increased work of breathing GU: Persistent skin separation at the middle of the right hydrocelectomy incision with serosanguineous drainage upon first approach.  Wound was probed with a sterile Q-tip and subsequently able to drain approximately 150 mL dark red serous fluid without clot material.  Persistent tense fluid collection palpable beneath the skin.  Patient reported relief of discomfort with drainage.  Wound opening was anesthetized using 2% lidocaine and subsequently expanded to approximately 1 cm.  Iodoform packing was replaced inside the wound with an exposed tail remaining.  Wound was covered by 4 x 4 gauze pads. Skin: No rashes, bruises or suspicious lesions Neurologic: Grossly intact, no focal deficits, moving all 4 extremities Psychiatric: Normal mood and affect  Assessment & Plan:   1. Scrotal hematoma Persistent hematoma versus seroma noted on physical exam today.  Once again able to express approximately 150 mL serosanguineous fluid.  Wound opening was expanded to approximately 1 cm and repacked with iodoform dressing.  Counseled patient to remove 1 to 2 cm of packing daily will get to medical of encouraging healing by secondary intent.  He expressed understanding.  I will see him back in clinic in 1 week for recheck.  Advised him to complete his prescribed ciprofloxacin in the meantime.  No signs of acute infection today; counseled patient on signs of infection including erythema, edema, purulence, fever, chills, and new or worse pain.  If fluid collection persists, may consider return to the OR for further management.  Return in about 1 week (around 04/14/2019) for Wound recheck.  Carman Ching, PA-C  [facility] [address], [address] [place], [place] [address] [phone]

## 2019-04-13 ENCOUNTER — Other Ambulatory Visit
Admission: RE | Admit: 2019-04-13 | Discharge: 2019-04-13 | Disposition: A | Discharge status: Home / Self Care | Payer: BLUE CROSS/BLUE SHIELD | Source: Ambulatory Visit | Attending: Urology | Admitting: Urology

## 2019-04-13 ENCOUNTER — Encounter: Payer: Self-pay | Admitting: Physician Assistant

## 2019-04-13 ENCOUNTER — Other Ambulatory Visit: Payer: Self-pay

## 2019-04-13 ENCOUNTER — Other Ambulatory Visit: Payer: Self-pay | Admitting: Radiology

## 2019-04-13 ENCOUNTER — Ambulatory Visit (INDEPENDENT_AMBULATORY_CARE_PROVIDER_SITE_OTHER): Payer: BLUE CROSS/BLUE SHIELD | Admitting: Physician Assistant

## 2019-04-13 VITALS — BP 117/74 | HR 79 | Ht 72.0 in | Wt 198.0 lb

## 2019-04-13 DIAGNOSIS — Z20828 Contact with and (suspected) exposure to other viral communicable diseases: Secondary | ICD-10-CM | POA: Insufficient documentation

## 2019-04-13 DIAGNOSIS — S3022XA Contusion of scrotum and testes, initial encounter: Secondary | ICD-10-CM

## 2019-04-13 DIAGNOSIS — Z01812 Encounter for preprocedural laboratory examination: Secondary | ICD-10-CM | POA: Diagnosis present

## 2019-04-13 LAB — SARS CORONAVIRUS 2 (TAT 6-24 HRS): SARS Coronavirus 2: NEGATIVE

## 2019-04-13 NOTE — Progress Notes (Signed)
04/13/2019 12:04 PM   Dan Li 1955-05-18 [id number]  CC: Wound check  HPI: Dan Li is a 63 y.o. male who presents today for wound check. He underwent right hydrocelectomy with Dr. Apolinar Junes on 03/02/2019 and subsequently experienced right hemiscrotal swelling with skin separation.  His surgical incision has now been manipulated twice in clinic, most recently 6 days ago, with repeat drainage of approximately 200 cc of fluid consistent with hematoma versus seroma each time.  Wound has been packed each time and patient instructed to remove approximately 2 inches of packing daily.  Today, he reports removing the last of his packing from last week yesterday.  He also completed his most recently prescribed round of ciprofloxacin 2 days ago.  He notes less pressure and pain in the right hemiscrotum today, however he does believe that the fluid collection has returned.  He denies fever, chills, nausea, and vomiting.   PMH: Past Medical History:  Diagnosis Date  . 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 HYDROCELE EXCISION Right 03/02/2019   Procedure: HYDROCELECTOMY ADULT;  Surgeon: Vanna Scotland, MD;  Location: [facility] ORS;  Service: Urology;  Laterality: Right;  . NO PAST SURGERIES      Home Medications:  Allergies as of 04/13/2019      Reactions   Thorazine [chlorpromazine] Anaphylaxis      Medication List       Accurate as of April 13, 2019 12:04 PM. If you have any questions, ask your nurse or doctor.        STOP taking these medications   ciprofloxacin 500 MG tablet Commonly known as: CIPRO Stopped by: Carman Ching, PA-C   HYDROcodone-acetaminophen 5-325 MG tablet Commonly known as: NORCO/VICODIN Stopped by: Carman Ching, PA-C     TAKE these medications   Vitamin B-12 1000 MCG/15ML Liqd Take 15 mLs by mouth 3 (three) times a week.       Allergies:  Allergies  Allergen  Reactions  . Thorazine [Chlorpromazine] Anaphylaxis    Family History: Family History  Problem Relation Age of Onset  . Bladder Cancer Neg Hx   . Prostate cancer Neg Hx   . Kidney cancer Neg Hx     Social History:   reports that he has quit smoking. He has never used smokeless tobacco. He reports current alcohol use. He reports that he does not use drugs.  ROS: UROLOGY Frequent Urination?: No Hard to postpone urination?: No Burning/pain with urination?: No Get up at night to urinate?: No Leakage of urine?: No Urine stream starts and stops?: No Trouble starting stream?: No Do you have to strain to urinate?: No Blood in urine?: No Urinary tract infection?: No Sexually transmitted disease?: No Injury to kidneys or bladder?: No Painful intercourse?: No Weak stream?: No Erection problems?: No Penile pain?: No  Gastrointestinal Nausea?: No Vomiting?: No Indigestion/heartburn?: No Diarrhea?: No Constipation?: No  Constitutional Fever: No Night sweats?: No Weight loss?: No Fatigue?: No  Skin Skin rash/lesions?: No Itching?: No  Eyes Blurred vision?: No Double vision?: No  Ears/Nose/Throat Sore throat?: No Sinus problems?: No  Hematologic/Lymphatic Swollen glands?: No Easy bruising?: No  Cardiovascular Leg swelling?: No Chest pain?: No  Respiratory Cough?: No Shortness of breath?: No  Endocrine Excessive thirst?: No  Musculoskeletal Back pain?: No Joint pain?: No  Neurological Headaches?: No Dizziness?: No  Psychologic Depression?: No Anxiety?: No  Physical Exam: BP 117/74   Pulse 79   Ht 6' (  1.829 m)   Wt 198 lb (89.8 kg)   BMI 26.85 kg/m   Constitutional:  Alert and oriented, no acute distress, nontoxic appearing HEENT: [place], AT Cardiovascular: No clubbing, cyanosis, or edema Respiratory: Normal respiratory effort, no increased work of breathing GU: Tense right hemiscrotal fluid collection again noted today, size increased from 1  week ago.  Previously expanded drainage site still open.  Only able to express scant, serosanguineous fluid from the drainage site today.  Scrotum without warmth, edema, purulence, crepitus, or tenderness. Skin: No rashes, bruises or suspicious lesions Neurologic: Grossly intact, no focal deficits, moving all 4 extremities Psychiatric: Normal mood and affect  Assessment & Plan:   1. Scrotal hematoma 63 year old male s/p right hydrocelectomy presents again today for wound recheck in the setting of postoperative seroma versus hematoma.  Fluid collection has reaccumulated in the past week despite repeat packing and wound exploration 1 week ago.  In discussing case with Dr. Erlene Quan, she would like to proceed with incision and drainage with drain placement in the operating room this week.  I discussed with the patient the risks of the procedure, including bleeding, infection, and damage to surrounding structures.  Despite this, he wishes to proceed.  Booking sheet completed and patient sent for preoperative Covid testing today.  I counseled him to quarantine immediately thereafter leading up to surgery.  He expressed understanding.  Surgical scheduler Amy to contact patient with further instructions.  Debroah Loop, PA-C  [facility] [address] [place],  [address] [phone]

## 2019-04-13 NOTE — H&P (View-Only) (Signed)
04/13/2019 12:04 PM   [name] 1955-05-18 [id number]  CC: Wound check  HPI: Dan Li is a 63 y.o. male who presents today for wound check. He underwent right hydrocelectomy with Dr. Apolinar Junes on 03/02/2019 and subsequently experienced right hemiscrotal swelling with skin separation.  His surgical incision has now been manipulated twice in clinic, most recently 6 days ago, with repeat drainage of approximately 200 cc of fluid consistent with hematoma versus seroma each time.  Wound has been packed each time and patient instructed to remove approximately 2 inches of packing daily.  Today, he reports removing the last of his packing from last week yesterday.  He also completed his most recently prescribed round of ciprofloxacin 2 days ago.  He notes less pressure and pain in the right hemiscrotum today, however he does believe that the fluid collection has returned.  He denies fever, chills, nausea, and vomiting.   PMH: Past Medical History:  Diagnosis Date  . 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 HYDROCELE EXCISION Right 03/02/2019   Procedure: HYDROCELECTOMY ADULT;  Surgeon: Vanna Scotland, MD;  Location: [facility] ORS;  Service: Urology;  Laterality: Right;  . NO PAST SURGERIES      Home Medications:  Allergies as of 04/13/2019      Reactions   Thorazine [chlorpromazine] Anaphylaxis      Medication List       Accurate as of April 13, 2019 12:04 PM. If you have any questions, ask your nurse or doctor.        STOP taking these medications   ciprofloxacin 500 MG tablet Commonly known as: CIPRO Stopped by: Carman Ching, PA-C   HYDROcodone-acetaminophen 5-325 MG tablet Commonly known as: NORCO/VICODIN Stopped by: Carman Ching, PA-C     TAKE these medications   Vitamin B-12 1000 MCG/15ML Liqd Take 15 mLs by mouth 3 (three) times a week.       Allergies:  Allergies  Allergen  Reactions  . Thorazine [Chlorpromazine] Anaphylaxis    Family History: Family History  Problem Relation Age of Onset  . Bladder Cancer Neg Hx   . Prostate cancer Neg Hx   . Kidney cancer Neg Hx     Social History:   reports that he has quit smoking. He has never used smokeless tobacco. He reports current alcohol use. He reports that he does not use drugs.  ROS: UROLOGY Frequent Urination?: No Hard to postpone urination?: No Burning/pain with urination?: No Get up at night to urinate?: No Leakage of urine?: No Urine stream starts and stops?: No Trouble starting stream?: No Do you have to strain to urinate?: No Blood in urine?: No Urinary tract infection?: No Sexually transmitted disease?: No Injury to kidneys or bladder?: No Painful intercourse?: No Weak stream?: No Erection problems?: No Penile pain?: No  Gastrointestinal Nausea?: No Vomiting?: No Indigestion/heartburn?: No Diarrhea?: No Constipation?: No  Constitutional Fever: No Night sweats?: No Weight loss?: No Fatigue?: No  Skin Skin rash/lesions?: No Itching?: No  Eyes Blurred vision?: No Double vision?: No  Ears/Nose/Throat Sore throat?: No Sinus problems?: No  Hematologic/Lymphatic Swollen glands?: No Easy bruising?: No  Cardiovascular Leg swelling?: No Chest pain?: No  Respiratory Cough?: No Shortness of breath?: No  Endocrine Excessive thirst?: No  Musculoskeletal Back pain?: No Joint pain?: No  Neurological Headaches?: No Dizziness?: No  Psychologic Depression?: No Anxiety?: No  Physical Exam: BP 117/74   Pulse 79   Ht 6' (  1.829 m)   Wt 198 lb (89.8 kg)   BMI 26.85 kg/m   Constitutional:  Alert and oriented, no acute distress, nontoxic appearing HEENT: [place], AT Cardiovascular: No clubbing, cyanosis, or edema Respiratory: Normal respiratory effort, no increased work of breathing GU: Tense right hemiscrotal fluid collection again noted today, size increased from 1  week ago.  Previously expanded drainage site still open.  Only able to express scant, serosanguineous fluid from the drainage site today.  Scrotum without warmth, edema, purulence, crepitus, or tenderness. Skin: No rashes, bruises or suspicious lesions Neurologic: Grossly intact, no focal deficits, moving all 4 extremities Psychiatric: Normal mood and affect  Assessment & Plan:   1. Scrotal hematoma 63 year old male s/p right hydrocelectomy presents again today for wound recheck in the setting of postoperative seroma versus hematoma.  Fluid collection has reaccumulated in the past week despite repeat packing and wound exploration 1 week ago.  In discussing case with Dr. Erlene Quan, she would like to proceed with incision and drainage with drain placement in the operating room this week.  I discussed with the patient the risks of the procedure, including bleeding, infection, and damage to surrounding structures.  Despite this, he wishes to proceed.  Booking sheet completed and patient sent for preoperative Covid testing today.  I counseled him to quarantine immediately thereafter leading up to surgery.  He expressed understanding.  Surgical scheduler Amy to contact patient with further instructions.  Debroah Loop, PA-C  [facility] [address] [place],  [address] [phone]

## 2019-04-15 ENCOUNTER — Inpatient Hospital Stay: Admission: RE | Admit: 2019-04-15 | Payer: BLUE CROSS/BLUE SHIELD | Source: Ambulatory Visit

## 2019-04-16 ENCOUNTER — Other Ambulatory Visit: Payer: Self-pay

## 2019-04-16 ENCOUNTER — Ambulatory Visit: Payer: BLUE CROSS/BLUE SHIELD | Admitting: Certified Registered"

## 2019-04-16 ENCOUNTER — Ambulatory Visit
Admission: RE | Admit: 2019-04-16 | Discharge: 2019-04-16 | Disposition: A | Discharge status: Home / Self Care | Payer: BLUE CROSS/BLUE SHIELD | Attending: Urology | Admitting: Urology

## 2019-04-16 ENCOUNTER — Encounter: Payer: Self-pay | Admitting: Urology

## 2019-04-16 ENCOUNTER — Encounter
Admission: RE | Disposition: A | Discharge status: Home / Self Care | Payer: Self-pay | Source: Home / Self Care | Attending: Urology

## 2019-04-16 DIAGNOSIS — Z87891 Personal history of nicotine dependence: Secondary | ICD-10-CM | POA: Diagnosis not present

## 2019-04-16 DIAGNOSIS — N509 Disorder of male genital organs, unspecified: Secondary | ICD-10-CM | POA: Insufficient documentation

## 2019-04-16 DIAGNOSIS — S3022XA Contusion of scrotum and testes, initial encounter: Secondary | ICD-10-CM

## 2019-04-16 DIAGNOSIS — N430 Encysted hydrocele: Secondary | ICD-10-CM

## 2019-04-16 HISTORY — PX: INCISION AND DRAINAGE ABSCESS: SHX5864

## 2019-04-16 LAB — BASIC METABOLIC PANEL
Anion gap: 8 (ref 5–15)
BUN: 16 mg/dL (ref 8–23)
CO2: 26 mmol/L (ref 22–32)
Calcium: 9 mg/dL (ref 8.9–10.3)
Chloride: 107 mmol/L (ref 98–111)
Creatinine, Ser: 1.01 mg/dL (ref 0.61–1.24)
GFR calc Af Amer: >60 mL/min (ref >60)
GFR calc non Af Amer: >60 mL/min (ref >60)
Glucose, Bld: 104 mg/dL — ABNORMAL HIGH (ref 70–99)
Potassium: 4.2 mmol/L (ref 3.5–5.1)
Sodium: 141 mmol/L (ref 135–145)

## 2019-04-16 LAB — CBC
HCT: 43.3 % (ref 39.0–52.0)
Hemoglobin: 14.5 g/dL (ref 13.0–17.0)
MCH: 27.2 pg (ref 26.0–34.0)
MCHC: 33.5 g/dL (ref 30.0–36.0)
MCV: 81.1 fL (ref 80.0–100.0)
Platelets: 228 10*3/uL (ref 150–400)
RBC: 5.34 MIL/uL (ref 4.22–5.81)
RDW: 13.1 % (ref 11.5–15.5)
WBC: 5.7 10*3/uL (ref 4.0–10.5)
nRBC: 0 % (ref 0.0–0.2)

## 2019-04-16 SURGERY — INCISION AND DRAINAGE, ABSCESS
Anesthesia: General | Laterality: Right

## 2019-04-16 MED ORDER — FENTANYL CITRATE (PF) 100 MCG/2ML IJ SOLN
INTRAMUSCULAR | Status: DC | PRN
  Administered 2019-04-16: 50 ug via INTRAVENOUS

## 2019-04-16 MED ORDER — MIDAZOLAM HCL 2 MG/2ML IJ SOLN
INTRAMUSCULAR | Status: DC | PRN
  Administered 2019-04-16: 2 mg via INTRAVENOUS

## 2019-04-16 MED ORDER — LIDOCAINE HCL (CARDIAC) PF 100 MG/5ML IV SOSY
PREFILLED_SYRINGE | INTRAVENOUS | Status: DC | PRN
  Administered 2019-04-16: 100 mg via INTRAVENOUS

## 2019-04-16 MED ORDER — PROPOFOL 10 MG/ML IV BOLUS
INTRAVENOUS | Status: DC | PRN
  Administered 2019-04-16: 200 mg via INTRAVENOUS

## 2019-04-16 MED ORDER — CEFAZOLIN SODIUM-DEXTROSE 2-4 GM/100ML-% IV SOLN
2.0000 g | INTRAVENOUS | Status: AC
  Administered 2019-04-16: 2 g via INTRAVENOUS

## 2019-04-16 MED ORDER — ACETAMINOPHEN 10 MG/ML IV SOLN
INTRAVENOUS | Status: DC | PRN
  Administered 2019-04-16: 1000 mg via INTRAVENOUS

## 2019-04-16 MED ORDER — CEFAZOLIN SODIUM-DEXTROSE 2-4 GM/100ML-% IV SOLN
INTRAVENOUS | Status: AC
  Filled 2019-04-16: qty 100

## 2019-04-16 MED ORDER — MIDAZOLAM HCL 2 MG/2ML IJ SOLN
INTRAMUSCULAR | Status: AC
  Filled 2019-04-16: qty 2

## 2019-04-16 MED ORDER — SODIUM CHLORIDE 0.9 % IV SOLN
INTRAVENOUS | Status: DC | PRN
  Administered 2019-04-16: 09:00:00 120 mL

## 2019-04-16 MED ORDER — GLYCOPYRROLATE 0.2 MG/ML IJ SOLN
INTRAMUSCULAR | Status: DC | PRN
  Administered 2019-04-16 (×2): .2 mg via INTRAVENOUS

## 2019-04-16 MED ORDER — FENTANYL CITRATE (PF) 100 MCG/2ML IJ SOLN: 25.0000 ug | INTRAMUSCULAR | Status: DC | PRN

## 2019-04-16 MED ORDER — ACETAMINOPHEN 10 MG/ML IV SOLN
INTRAVENOUS | Status: AC
  Filled 2019-04-16: qty 100

## 2019-04-16 MED ORDER — ONDANSETRON HCL 4 MG/2ML IJ SOLN: 4.0000 mg | Freq: Once | INTRAMUSCULAR | Status: DC | PRN

## 2019-04-16 MED ORDER — LACTATED RINGERS IV SOLN: INTRAVENOUS | Status: DC

## 2019-04-16 MED ORDER — FENTANYL CITRATE (PF) 100 MCG/2ML IJ SOLN
INTRAMUSCULAR | Status: AC
  Filled 2019-04-16: qty 2

## 2019-04-16 MED ORDER — BUPIVACAINE HCL 0.5 % IJ SOLN
INTRAMUSCULAR | Status: DC | PRN
  Administered 2019-04-16: 10 mL

## 2019-04-16 MED ORDER — FAMOTIDINE 20 MG PO TABS
ORAL_TABLET | ORAL | Status: AC
  Administered 2019-04-16: 20 mg via ORAL
  Filled 2019-04-16: qty 1

## 2019-04-16 MED ORDER — ONDANSETRON HCL 4 MG/2ML IJ SOLN
INTRAMUSCULAR | Status: DC | PRN
  Administered 2019-04-16: 4 mg via INTRAVENOUS

## 2019-04-16 MED ORDER — FAMOTIDINE 20 MG PO TABS: 20.0000 mg | ORAL_TABLET | Freq: Once | ORAL | Status: AC

## 2019-04-16 MED ORDER — PHENYLEPHRINE HCL (PRESSORS) 10 MG/ML IV SOLN
INTRAVENOUS | Status: DC | PRN
  Administered 2019-04-16 (×2): 100 ug via INTRAVENOUS

## 2019-04-16 MED ORDER — DEXAMETHASONE SODIUM PHOSPHATE 10 MG/ML IJ SOLN
INTRAMUSCULAR | Status: DC | PRN
  Administered 2019-04-16 (×2): 10 mg via INTRAVENOUS

## 2019-04-16 SURGICAL SUPPLY — 34 items
BNDG CONFORM 2 STRL LF (GAUZE/BANDAGES/DRESSINGS) ×1 IMPLANT
CANISTER SUCT 1200ML W/VALVE (MISCELLANEOUS) ×2 IMPLANT
CHLORAPREP W/TINT 26 (MISCELLANEOUS) ×2 IMPLANT
COVER WAND RF STERILE (DRAPES) ×2 IMPLANT
DERMABOND ADVANCED (GAUZE/BANDAGES/DRESSINGS) ×1
DERMABOND ADVANCED .7 DNX12 (GAUZE/BANDAGES/DRESSINGS) ×1 IMPLANT
DRAIN PENROSE 1/4X12 LTX STRL (WOUND CARE) ×2 IMPLANT
DRAPE LAPAROTOMY 77X122 PED (DRAPES) ×2 IMPLANT
DRSG GAUZE FLUFF 36X18 (GAUZE/BANDAGES/DRESSINGS) ×2 IMPLANT
ELECT REM PT RETURN 9FT ADLT (ELECTROSURGICAL) ×2
ELECTRODE REM PT RTRN 9FT ADLT (ELECTROSURGICAL) ×1 IMPLANT
GAUZE SPONGE 4X4 12PLY STRL (GAUZE/BANDAGES/DRESSINGS) ×2 IMPLANT
GLOVE BIO SURGEON STRL SZ 6.5 (GLOVE) ×2 IMPLANT
GLOVE BIO SURGEON STRL SZ7 (GLOVE) ×2 IMPLANT
GOWN STRL REUS W/ TWL LRG LVL3 (GOWN DISPOSABLE) ×2 IMPLANT
GOWN STRL REUS W/TWL LRG LVL3 (GOWN DISPOSABLE) ×2
KIT TURNOVER KIT A (KITS) ×2 IMPLANT
LABEL OR SOLS (LABEL) ×2 IMPLANT
NDL HYPO 25X1 1.5 SAFETY (NEEDLE) ×1 IMPLANT
NEEDLE HYPO 25X1 1.5 SAFETY (NEEDLE) ×2 IMPLANT
NS IRRIG 500ML POUR BTL (IV SOLUTION) ×2 IMPLANT
PACK BASIN MINOR ARMC (MISCELLANEOUS) ×2 IMPLANT
SOL PREP PVP 2OZ (MISCELLANEOUS) ×2
SOLUTION PREP PVP 2OZ (MISCELLANEOUS) ×1 IMPLANT
SUPPORETR ATHLETIC LG (MISCELLANEOUS) ×1 IMPLANT
SUPPORTER ATHLETIC LG (MISCELLANEOUS) ×2
SUT CHROMIC 4 0 RB 1X27 (SUTURE) ×1 IMPLANT
SUT ETHILON 3-0 FS-10 30 BLK (SUTURE) ×4
SUT VIC AB 3-0 SH 27 (SUTURE) ×1
SUT VIC AB 3-0 SH 27X BRD (SUTURE) ×2 IMPLANT
SUT VIC AB 4-0 SH 27 (SUTURE)
SUT VIC AB 4-0 SH 27XANBCTRL (SUTURE) ×1 IMPLANT
SUTURE EHLN 3-0 FS-10 30 BLK (SUTURE) ×1 IMPLANT
SYR 10ML LL (SYRINGE) ×2 IMPLANT

## 2019-04-16 NOTE — Interval H&P Note (Signed)
History and Physical Interval Note:  04/16/2019 8:17 AM  Dan Li  has presented today for surgery, with the diagnosis of right scrotal seroma versus hematoma.  The various methods of treatment have been discussed with the patient and family. After consideration of risks, benefits and other options for treatment, the patient has consented to  Procedure(s): INCISION AND DRAINAGE scrotal seroma vs hematoma (Right) as a surgical intervention.  The patient's history has been reviewed, patient examined, no change in status, stable for surgery.  I have reviewed the patient's chart and labs.  Questions were answered to the patient's satisfaction.    RRR CTAB  Plan for scrotal exploration, break up any loculations and drain, packing placement of Penrose drain.  Hollice Espy

## 2019-04-16 NOTE — Anesthesia Post-op Follow-up Note (Signed)
Anesthesia QCDR form completed.        

## 2019-04-16 NOTE — Transfer of Care (Signed)
Immediate Anesthesia Transfer of Care Note  Patient: Dan Li  Procedure(s) Performed: INCISION AND DRAINAGE scrotal seroma vs hematoma (Right )  Patient Location: PACU  Anesthesia Type:General  Level of Consciousness: awake, drowsy and patient cooperative  Airway & Oxygen Therapy: Patient Spontanous Breathing and Patient connected to face mask oxygen  Post-op Assessment: Report given to RN and Post -op Vital signs reviewed and stable  Post vital signs: Reviewed and stable  Last Vitals:  Vitals Value Taken Time  BP 122/87 04/16/19 0938  Temp    Pulse 70 04/16/19 0939  Resp 11 04/16/19 0939  SpO2 100 % 04/16/19 0939  Vitals shown include unvalidated device data.  Last Pain:  Vitals:   04/16/19 0810  TempSrc: Temporal  PainSc: 0-No pain         Complications: No apparent anesthesia complications

## 2019-04-16 NOTE — Anesthesia Preprocedure Evaluation (Signed)
Anesthesia Evaluation  Patient identified by MRN, date of birth, ID band Patient awake    Reviewed: Allergy & Precautions, H&P , NPO status , Patient's Chart, lab work & pertinent test results, reviewed documented beta blocker date and time   Airway Mallampati: II  TM Distance: >3 FB Neck ROM: full    Dental  (+) Teeth Intact   Pulmonary neg pulmonary ROS, former smoker,    Pulmonary exam normal        Cardiovascular Exercise Tolerance: Good negative cardio ROS Normal cardiovascular exam Rate:Normal     Neuro/Psych negative neurological ROS  negative psych ROS   GI/Hepatic negative GI ROS, Neg liver ROS,   Endo/Other  negative endocrine ROS  Renal/GU negative Renal ROS  negative genitourinary   Musculoskeletal   Abdominal   Peds  Hematology negative hematology ROS (+)   Anesthesia Other Findings   Reproductive/Obstetrics negative OB ROS                             Anesthesia Physical Anesthesia Plan  ASA: II  Anesthesia Plan: General LMA   Post-op Pain Management:    Induction:   PONV Risk Score and Plan:   Airway Management Planned:   Additional Equipment:   Intra-op Plan:   Post-operative Plan:   Informed Consent: I have reviewed the patients History and Physical, chart, labs and discussed the procedure including the risks, benefits and alternatives for the proposed anesthesia with the patient or authorized representative who has indicated his/her understanding and acceptance.     Plan Discussed with: CRNA  Anesthesia Plan Comments:         Anesthesia Quick Evaluation  

## 2019-04-16 NOTE — Op Note (Signed)
Date of procedure: 04/16/19  Preoperative diagnosis:  1. Right hydrocele recurrence versus seroma  Postoperative diagnosis:  1. Same as above  Procedure: 1. Incision and drainage of right hemiscrotum  Surgeon: Vanna Scotland, MD  Anesthesia: General  Complications: None  Intraoperative findings: Approximately 50 cc of light red fluid drained from the right hemiscrotum without purulence.  Dependent drain placed and wound loosely closed.  Unclear whether this represented hydrocele recurrence versus postoperative seroma.  EBL: Minimal  Specimens: None  Drains: Penrose drain  Indication: Dan Li is a 63 y.o. patient with large multiloculated hydrocele status post repair about 6 weeks ago.  He has developed reaccumulation of the fluid which was drained in clinic x2 with a wick placed, despite this, the fluid continues to reaccumulate, currently tangerine sized without underlying infection.  After reviewing the management options for treatment, he elected to proceed with the above surgical procedure(s). We have discussed the potential benefits and risks of the procedure, side effects of the proposed treatment, the likelihood of the patient achieving the goals of the procedure, and any potential problems that might occur during the procedure or recuperation. Informed consent has been obtained.  Description of procedure:  The patient was taken to the operating room and general anesthesia was induced.  The patient was placed in the supine position, prepped and draped in the usual sterile fashion, and preoperative antibiotics were administered. A preoperative time-out was performed.   The right hemiscrotum was carefully examined.  At this point, the right transverse hemiincision on the scrotum was closer than a small area of granulation in the midline.  The scrotum itself had no overlying skin changes, no erythema no concern for infection.  Clinical exam was most consistent with possible  recurrent hydrocele although this could also represent a loculated seroma.  At this point time, the decision was made to treat this like a hydrocele and thus open up the same incision and try to dissect out some serous sac.  10 cc of half percent Marcaine was instilled into the previous wound.  A 15 blade was used to incise along this wound.  Unfortunately, the tissue planes were extremely stuck and no clear hydrocele sac was identified.  Ended up getting into the fluid collection almost immediately at which time approximately 50 cc of blood-tinged clear fluid was drained.  There is no cloudiness or purulence.  I then opened up the incision wider approximately same size as his previous incision and used my finger to break up any loculations inferiorly where the primary cavity was as well as somewhat medial and superior.  The testicle itself was relatively normal without surrounding fluid and was granulated in along the right lateral dependent border of the scrotum.  Once all fluid was adequately evacuated and the cavity was finger dissected widely open, I did irrigated copiously with gentamicin irrigation.  I then made a stab incision in the dependent aspect of the right hemiscrotum.  This was tunneled using a Kelly.  A Penrose drain was then placed along the length of the cavity and sutured in place using an nylon.  The cavity was then irrigated 1 additional time.  Ultimately, elected to close the incision very loosely.  I used loose interrupted 3-0 Vicryl sutures in the subcutaneous layers with air knots on purpose.  I loosely reapproximated the skin using 3-0 chromic suture in interrupted fashion.  The wound did weep a small amount of fluid which was expected and desired.  I did not close using  Dermabond as per usual in order to allow fluid to escape.  Cosmesis at this point was excellent.  Patient was then cleaned and dried.  A dressing of an ABD pad, scrotal fluffs and a scrotal support device were applied.   Patient was then cleaned and dried, repositioned in supine position, reversed myesthesia, and taken to the PACU in stable condition  Plan: We will plan to leave the day dependent drain for about 2 weeks or more depending on the amount of drainage.  Recommend continued scrotal support and avoidance of strenuous activity in the interim.  All questions answered.  Dan Li, M.D.

## 2019-04-16 NOTE — Discharge Instructions (Signed)
Hydrocelectomy, Adult, Care After This sheet gives you information about how to care for yourself after your procedure. Your health care provider may also give you more specific instructions. If you have problems or questions, contact your health care provider. What can I expect after the procedure? After your procedure, it is common to have mild discomfort, swelling, and bruising in the pouch that holds your testicles (scrotum). Follow these instructions at home: Bathing  Ask your health care provider when you can shower, take baths, or go swimming.  If you were told to wear an athletic support strap, take it off when you shower or take a bath. Incision care   Follow instructions from your health care provider about how to take care of your incision. Make sure you: ? Wash your hands with soap and water before you change your bandage (dressing). If soap and water are not available, use hand sanitizer. ? Change your dressing as told by your health care provider. ? Leave stitches (sutures) in place.  Check your incision and scrotum every day for signs of infection. Check for: ? More redness, swelling, or pain. ? Blood or fluid. ? Warmth. ? Pus or a bad smell. Managing pain, stiffness, and swelling  If directed, apply ice to the injured area: ? Put ice in a plastic bag. ? Place a towel between your skin and the bag. ? Leave the ice on for 20 minutes, 2-3 times per day. Driving  Do not drive for 24 hours if you were given a sedative.  Do not drive or use heavy machinery while taking prescription pain medicine.  Ask your health care provider when it is safe to drive. Activity  Do not do any activities that require great strength and energy (are vigorous) for as long as told by your health care provider.  Return to your normal activities as told by your health care provider. Ask your health care provider what activities are safe for you.  Do not lift anything that is heavier than  10 lb (4.5 kg) until your health care provider says that it is safe. General instructions  Take over-the-counter and prescription medicines only as told by your health care provider.  Keep all follow-up visits as told by your health care provider. This is important.  If you were given an athletic support strap, wear it as told by your health care provider.  If you had a drain put in during the procedure, you will need to return for a follow-up visit to have it removed. Contact a health care provider if:  Your pain is not controlled with medicine.  You have more redness or swelling around your scrotum.  You have blood or fluid coming from your scrotum.  Your incision feels warm to the touch.  You have pus or a bad smell coming from your scrotum.  You have a fever. This information is not intended to replace advice given to you by your health care provider. Make sure you discuss any questions you have with your health care provider. Document Released: 01/05/2015 Document Revised: 03/29/2017 Document Reviewed: 01/14/2016 Elsevier Patient Education  2020 [place]   1) The drugs that you were given will stay in your system until tomorrow so for the next 24 hours you should not:  A) Drive an automobile B) Make any legal decisions C) Drink any alcoholic beverage   2) You may resume regular meals tomorrow.  Today it is better to start  with liquids and gradually work up to solid foods.  You may eat anything you prefer, but it is better to start with liquids, then soup and crackers, and gradually work up to solid foods.   3) Please notify your doctor immediately if you have any unusual bleeding, trouble breathing, redness and pain at the surgery site, drainage, fever, or pain not relieved by medication.  4) Your post-operative visit with Dr.                                     is: Date:                        Time:    Please  call to schedule your post-operative visit.  5) Additional Instructions:

## 2019-04-16 NOTE — Anesthesia Procedure Notes (Signed)
Procedure Name: LMA Insertion Performed by: Fletcher-Harrison, Swara Donze, CRNA Pre-anesthesia Checklist: Patient identified, Emergency Drugs available, Patient being monitored and Suction available Patient Re-evaluated:Patient Re-evaluated prior to induction Oxygen Delivery Method: Circle system utilized Preoxygenation: Pre-oxygenation with 100% oxygen Induction Type: IV induction Ventilation: Mask ventilation without difficulty LMA: LMA inserted LMA Size: 4.0 Number of attempts: 1 Placement Confirmation: positive ETCO2,  CO2 detector and breath sounds checked- equal and bilateral Tube secured with: Tape Dental Injury: Teeth and Oropharynx as per pre-operative assessment        

## 2019-04-22 NOTE — Anesthesia Postprocedure Evaluation (Signed)
Anesthesia Post Note  Patient: Dan Li  Procedure(s) Performed: INCISION AND DRAINAGE scrotal seroma vs hematoma (Right )  Patient location during evaluation: PACU Anesthesia Type: General Level of consciousness: awake and alert Pain management: pain level controlled Vital Signs Assessment: post-procedure vital signs reviewed and stable Respiratory status: spontaneous breathing, nonlabored ventilation, respiratory function stable and patient connected to nasal cannula oxygen Cardiovascular status: blood pressure returned to baseline and stable Postop Assessment: no apparent nausea or vomiting Anesthetic complications: no     Last Vitals:  Vitals:   04/16/19 1019 04/16/19 1029  BP: 125/80 107/90  Pulse: (!) 55 (!) 59  Resp: 10 18  Temp: (!) 35.8 C (!) 36.2 C  SpO2: 100% 100%    Last Pain:  Vitals:   04/17/19 0808  TempSrc:   PainSc: 0-No pain                 Molli Barrows

## 2019-04-30 ENCOUNTER — Other Ambulatory Visit: Payer: Self-pay

## 2019-04-30 ENCOUNTER — Ambulatory Visit (INDEPENDENT_AMBULATORY_CARE_PROVIDER_SITE_OTHER): Payer: BLUE CROSS/BLUE SHIELD | Admitting: Physician Assistant

## 2019-04-30 ENCOUNTER — Encounter: Payer: Self-pay | Admitting: Physician Assistant

## 2019-04-30 VITALS — BP 98/65 | HR 81 | Ht 72.0 in | Wt 220.0 lb

## 2019-04-30 DIAGNOSIS — Z4889 Encounter for other specified surgical aftercare: Secondary | ICD-10-CM

## 2019-04-30 NOTE — Progress Notes (Signed)
Drain removal Scant drainage Can't probe incision Indurated, but smaller than before Slightly tender No pressure or pain Feels like it's better Drain removed, covered with gauze  Patient presented today for postoperative follow-up with possible drain removal.  He is POD 14    04/30/2019 1:33 PM   Dan Li Nov 08, 1955 [id number]  CC: Wound check, possible drain removal  HPI: Dan Li is a 63 y.o. male who presents today for postoperative follow-up with possible drain removal.  He is POD 14 from I&D of right hemiscrotum with Dr. Erlene Quan for management of right hydrocele recurrence versus seroma.  Operative findings significant for approximately 50 cc of light red fluid without purulence.  A dependent drain was placed in the posterior right scrotum to promote continued drainage.  Patient reports doing well in the interim.  He notes continued right hemiscrotal induration, however with significant improvement in right hemiscrotal size, pain, and pressure.  He has continued to wear a jockstrap for scrotal support.  He notes progressively decreased drainage from the dependent drain.  As of the past few days, he reports only scant drainage with slight discoloration of overlying gauze.  The gauze has not been saturated.    PMH: Past Medical History:  Diagnosis Date  . 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 HYDROCELE EXCISION Right 03/02/2019   Procedure: HYDROCELECTOMY ADULT;  Surgeon: Hollice Espy, MD;  Location: [facility] ORS;  Service: Urology;  Laterality: Right;  . INCISION AND DRAINAGE ABSCESS Right 04/16/2019   Procedure: INCISION AND DRAINAGE scrotal seroma vs hematoma;  Surgeon: Hollice Espy, MD;  Location: [facility] ORS;  Service: Urology;  Laterality: Right;  . NO PAST SURGERIES      Home Medications:  Allergies as of 04/30/2019      Reactions   Thorazine [chlorpromazine] Anaphylaxis      Medication List    as of April 30, 2019  1:33 PM   You have not been prescribed any medications.     Allergies:  Allergies  Allergen Reactions  . Thorazine [Chlorpromazine] Anaphylaxis    Family History: Family History  Problem Relation Age of Onset  . Bladder Cancer Neg Hx   . Prostate cancer Neg Hx   . Kidney cancer Neg Hx     Social History:   reports that he has quit smoking. He has never used smokeless tobacco. He reports current alcohol use. He reports that he does not use drugs.  ROS: UROLOGY Frequent Urination?: No Hard to postpone urination?: No Burning/pain with urination?: No Get up at night to urinate?: No Leakage of urine?: No Urine stream starts and stops?: No Trouble starting stream?: No Do you have to strain to urinate?: No Blood in urine?: No Urinary tract infection?: No Sexually transmitted disease?: No Injury to kidneys or bladder?: No Painful intercourse?: No Weak stream?: No Erection problems?: No Penile pain?: No  Gastrointestinal Nausea?: No Vomiting?: No Indigestion/heartburn?: No Diarrhea?: No Constipation?: No  Constitutional Fever: No Night sweats?: No Weight loss?: No Fatigue?: No  Skin Skin rash/lesions?: No Itching?: No  Eyes Blurred vision?: No Double vision?: No  Ears/Nose/Throat Sore throat?: No Sinus problems?: No  Hematologic/Lymphatic Swollen glands?: No Easy bruising?: No  Cardiovascular Leg swelling?: No Chest pain?: No  Respiratory Cough?: No Shortness of breath?: No  Endocrine Excessive thirst?: No  Musculoskeletal Back pain?: No Joint pain?: No  Neurological Headaches?: No Dizziness?: No  Psychologic Depression?: No Anxiety?: No  Physical Exam:  BP 98/65   Pulse 81   Ht 6' (1.829 m)   Wt 220 lb (99.8 kg)   BMI 29.84 kg/m   Constitutional:  Alert and oriented, no acute distress, nontoxic appearing HEENT: [place], AT Cardiovascular: No clubbing, cyanosis, or edema Respiratory: Normal respiratory  effort, no increased work of breathing GU: Induration of the right hemiscrotum without fluctuance or crepitus.  Anterior right hemiscrotal surgical incision visualized with some lateral separation, however unable to probe to a deeper tissue plane.  Posterior dependent drain visualized with scant drainage.  Minimally tender hemiscrotum on palpation.  Size overall improved over previous clinic exams. Skin: No rashes, bruises or suspicious lesions Neurologic: Grossly intact, no focal deficits, moving all 4 extremities Psychiatric: Normal mood and affect  Assessment & Plan:   1. Encounter for post surgical wound check Right hemiscrotal enlargement significantly improved following I&D.  Induration remains, however without apparent fluid collection on physical exam today.  Based on patient's report of significantly decreased drainage, I elected to remove his Penrose drain today.  Using sterile suture scissors, I removed the fixing stitch and pulled the drain from the scrotum.  There is a scant amount of serosanguineous drainage with removal of this.  Applied 4 x 4 sterile gauze pad over the opening.  Patient tolerated well.  He is scheduled to follow-up with Dr. Apolinar Junes next week.  Carman Ching, PA-C  [facility] [address], [address] [place], [place] [address] [phone]

## 2019-05-05 ENCOUNTER — Ambulatory Visit (INDEPENDENT_AMBULATORY_CARE_PROVIDER_SITE_OTHER): Payer: Self-pay | Admitting: Urology

## 2019-05-05 ENCOUNTER — Encounter: Payer: Self-pay | Admitting: Urology

## 2019-05-05 ENCOUNTER — Other Ambulatory Visit: Payer: Self-pay

## 2019-05-05 VITALS — BP 145/78 | HR 80 | Ht 72.0 in | Wt 202.2 lb

## 2019-05-05 DIAGNOSIS — Z4889 Encounter for other specified surgical aftercare: Secondary | ICD-10-CM

## 2019-05-07 NOTE — Progress Notes (Signed)
05/05/2019 2:38 PM   Dan Li 1955-11-09 [id number]  Referring provider: Rusty Aus, MD [facility] [place],  [place] [address]  Chief Complaint  Patient presents with  . Routine Post Op    HPI: 64 year old male with large right hydrocele status post hydrocelectomy on 76/05/6071 complicated by reaccumulation of seromatous bloody fluid versus hematoma.  He ultimately returned back to the operating room after failed conservative management in the office on 04/16/2019 for reexploration, drainage of reaccumulated fluid and drain placement.  Patient was subsequently removed 2 weeks later on 04/30/2019.  He reports that since the drain was removed, he has had no subsequent reaccumulation accumulation of the fluid.  The incision is healing well.  He is very pleased with the overall cosmetic result at this point time.  He has minimal to no pain.  He is back to regular activity.   PMH: Past Medical History:  Diagnosis Date  . 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 HYDROCELE EXCISION Right 03/02/2019   Procedure: HYDROCELECTOMY ADULT;  Surgeon: Hollice Espy, MD;  Location: [facility] OR[facility];  Service: Urology;  Laterality: Right;  . INCISION AND DRAINAGE ABSCESS Right 04/16/2019   Procedure: INCISION AND DRAINAGE scrotal seroma vs hematoma;  Surgeon: Hollice Espy, MD;  Location: [facility] OR[facility];  Service: Urology;  Laterality: Right;  . NO PAST SURGERIES      Home Medications:  Allergies as of 05/05/2019      Reactions   Thorazine [chlorpromazine] Anaphylaxis      Medication List    as of May 05, 2019 11:59 PM   You have not been prescribed any medications.     Allergies:  Allergies  Allergen Reactions  . Thorazine [Chlorpromazine] Anaphylaxis    Family History: Family History  Problem Relation Age of Onset  . Bladder Cancer Neg Hx   . Prostate cancer Neg Hx   .  Kidney cancer Neg Hx     Social History:  reports that he has quit smoking. He has never used smokeless tobacco. He reports current alcohol use. He reports that he does not use drugs.  ROS: UROLOGY Frequent Urination?: No Hard to postpone urination?: No Burning/pain with urination?: No Get up at night to urinate?: No Leakage of urine?: No Urine stream starts and stops?: No Trouble starting stream?: No Do you have to strain to urinate?: No Blood in urine?: No Urinary tract infection?: No Sexually transmitted disease?: No Injury to kidneys or bladder?: No Painful intercourse?: No Weak stream?: No Erection problems?: No Penile pain?: No  Gastrointestinal Nausea?: No Vomiting?: No Indigestion/heartburn?: No Diarrhea?: No Constipation?: No  Constitutional Fever: No Night sweats?: No Weight loss?: No Fatigue?: No  Skin Skin rash/lesions?: No Itching?: No  Eyes Blurred vision?: No Double vision?: No  Ears/Nose/Throat Sore throat?: No Sinus problems?: No  Hematologic/Lymphatic Swollen glands?: No Easy bruising?: No  Cardiovascular Leg swelling?: No Chest pain?: No  Respiratory Cough?: No Shortness of breath?: No  Endocrine Excessive thirst?: No  Musculoskeletal Back pain?: No Joint pain?: No  Neurological Headaches?: No Dizziness?: No  Psychologic Depression?: No Anxiety?: No  Physical Exam: BP (!) 145/78   Pulse 80   Ht 6' (1.829 m)   Wt 202 lb 3.2 oz (91.7 kg)   BMI 27.42 kg/m   Constitutional:  Alert and oriented, No acute distress. HEENT: [place] AT, moist mucus membranes.  Trachea midline, no masses. Cardiovascular: No  clubbing, cyanosis, or edema. Respiratory: Normal respiratory effort, no increased work of breathing. GI: Abdomen is soft, nontender, nondistended, no abdominal masses GU: Drain incision no closed, well-healed.  Hemiscrotum decompressed.  There are some firmness of his right testicle but overall no fluctuance or  reaccumulation of fluid.  Wound is closed.  No erythema. Neurologic: Grossly intact, no focal deficits, moving all 4 extremities. Psychiatric: Normal mood and affect.   Assessment & Plan:    1. Encounter for post surgical wound check Healing well without acute reaccumulation Overall cosmesis is now excellent We discussed that the firmness in his right testicle should continue to improve as the hematoma continues to resolve Continue scrotal support as needed, return if scrotum fails to heal completely   Vanna Scotland, MD  [facility] [address] [place], [place] [address] [phone]

## 2019-07-30 ENCOUNTER — Ambulatory Visit: Payer: Self-pay | Attending: Internal Medicine

## 2019-07-30 DIAGNOSIS — Z23 Encounter for immunization: Secondary | ICD-10-CM

## 2019-07-30 NOTE — Progress Notes (Signed)
   Covid-19 Vaccination Clinic  Name:  Deamonte Sayegh    MRN: [id number] DOB: 1955-09-13  07/30/2019  Mr. Ickes was observed post Covid-19 immunization for 15 minutes without incident. He was provided with Vaccine Information Sheet and instruction to access the V-Safe system.   Mr. Chesnut was instructed to call 911 with any severe reactions post vaccine: Marland Kitchen Difficulty breathing  . Swelling of face and throat  . A fast heartbeat  . A bad rash all over body  . Dizziness and weakness   Immunizations Administered    Name Date Dose VIS Date Route   Pfizer COVID-19 Vaccine 07/30/2019 12:02 PM 0.3 mL 04/10/2019 Intramuscular   Manufacturer: [employer], [employer]   Lot: [id number]   NDC: [address]-5

## 2019-08-25 ENCOUNTER — Ambulatory Visit: Payer: Self-pay

## 2019-09-01 ENCOUNTER — Ambulatory Visit: Payer: Self-pay | Attending: Internal Medicine

## 2019-09-01 DIAGNOSIS — Z23 Encounter for immunization: Secondary | ICD-10-CM

## 2019-09-01 NOTE — Progress Notes (Signed)
   Covid-19 Vaccination Clinic  Name:  Dan Li    MRN: [id number] DOB: 03/04/1956  09/01/2019  Dan Li was observed post Covid-19 immunization for 15 minutes without incident. He was provided with Vaccine Information Sheet and instruction to access the V-Safe system.   Dan Li was instructed to call 911 with any severe reactions post vaccine: Marland Kitchen Difficulty breathing  . Swelling of face and throat  . A fast heartbeat  . A bad rash all over body  . Dizziness and weakness   Immunizations Administered    Name Date Dose VIS Date Route   Pfizer COVID-19 Vaccine 09/01/2019  1:03 PM 0.3 mL 06/24/2018 Intramuscular   Manufacturer: [employer], [employer]   Lot: [id number]   NDC: [address]-5

## 2020-01-21 NOTE — Progress Notes (Addendum)
error 

## 2020-01-22 ENCOUNTER — Encounter: Payer: Self-pay | Admitting: Urology

## 2020-01-22 ENCOUNTER — Ambulatory Visit (INDEPENDENT_AMBULATORY_CARE_PROVIDER_SITE_OTHER): Payer: Medicare HMO | Admitting: Urology

## 2020-01-22 ENCOUNTER — Other Ambulatory Visit: Payer: Self-pay

## 2020-01-22 VITALS — BP 146/80 | HR 77 | Ht 72.0 in | Wt 200.0 lb

## 2020-01-22 DIAGNOSIS — R1909 Other intra-abdominal and pelvic swelling, mass and lump: Secondary | ICD-10-CM | POA: Diagnosis not present

## 2020-01-22 NOTE — Progress Notes (Signed)
01/22/2020 8:12 AM   Dan Li December 22, 1955 [id number]  Referring provider: [name], MD [phone] [facility] [address] [facility] [place],  [place] [address] Chief Complaint  Patient presents with  . Testicle Pain    HPI: Dan Li is a 64 y.o. male who return today for evaluation of right swollen scrotum.   -S/p hydrocelectomy on 03/02/2019 complicated by reaccumulation of seromatous bloody fluid versus hematoma.   -He returned to OR after failed conservative management in the office on 04/16/2019 for reexploration, drainage of reaccumulated fluid and drain placement. -He was last seen by Dr. Apolinar Li on 05/05/2019. He noted since drained was removed on 04/20/2019 he had no subsequent reaccumulation accumulation of the fluid.  -He was very pleased with the overall cosmetic result at that point time. -He reports recurrent swelling superior to the testis and mild pain radiating into the inguinal area ~3 months ago. He feels like symptoms are similar prior to surgeries in 2020 with Dr. Apolinar Li  PMH: Past Medical History:  Diagnosis Date  . 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 HYDROCELE EXCISION Right 03/02/2019   Procedure: HYDROCELECTOMY ADULT;  Surgeon: Dan Scotland, MD;  Location: [facility] ORS;  Service: Urology;  Laterality: Right;  . INCISION AND DRAINAGE ABSCESS Right 04/16/2019   Procedure: INCISION AND DRAINAGE scrotal seroma vs hematoma;  Surgeon: Dan Scotland, MD;  Location: [facility] ORS;  Service: Urology;  Laterality: Right;  . NO PAST SURGERIES      Home Medications:  Allergies as of 01/22/2020      Reactions   Thorazine [chlorpromazine] Anaphylaxis      Medication List    as of January 22, 2020  8:12 AM   You have not been prescribed any medications.     Allergies:  Allergies  Allergen Reactions  . Thorazine [Chlorpromazine] Anaphylaxis    Family History: Family  History  Problem Relation Age of Onset  . Bladder Cancer Neg Hx   . Prostate cancer Neg Hx   . Kidney cancer Neg Hx     Social History:  reports that he has quit smoking. He has never used smokeless tobacco. He reports current alcohol use. He reports that he does not use drugs.   Physical Exam: BP (!) 146/80   Pulse 77   Ht 6' (1.829 m)   Wt 200 lb (90.7 kg)   BMI 27.12 kg/m   Constitutional:  Alert and oriented, No acute distress. HEENT: [place] AT, moist mucus membranes.  Trachea midline, no masses. Cardiovascular: No clubbing, cyanosis, or edema. Respiratory: Normal respiratory effort, no increased work of breathing. GI: Abdomen is soft, nontender, nondistended, no abdominal masses GU: No CVA tenderness.  No bladder fullness or masses.  Patient with circumcised phallus.  Urethral meatus is patent.  No penile discharge. No penile lesions or rashes. Scrotum without lesions, cysts, rashes and/or edema.  Testes are located scrotally bilaterally. 5 cm mass inguinal region superior to testes, not reducible.  Left and right epididymis are normal. Skin: No rashes, bruises or suspicious lesions. Neurologic: Grossly intact, no focal deficits, moving all 4 extremities. Psychiatric: Normal mood and affect.   Assessment & Plan:    1. Right groin mass; cord hydrocele versus hernia -S/p hydrocelectomy on 03/02/2019 complicated by reaccumulation of seroma versus hydrocele recurrence  -Noted recurrent swelling 3 months ago similar to prior -Physical exam today is suspicious for hydrocele of cord vs hernia. I recommend he  consult with general surgery for an opinion regarding the possibility of hernia. I will defer any additional imaging imaging to general surgery at this time.  -If there is no evidence of a hernia I will plan for a scrotal ultrasound and consider aspiration if the mass is fluid.    [facility] [address] [place], [place] [address] [phone]  I, Dan Li, am acting as a scribe for Dr. Lorin Picket C. Candie Li,  I have reviewed the above documentation for accuracy and completeness, and I agree with the above.   Dan Altes, MD

## 2020-02-01 ENCOUNTER — Ambulatory Visit: Payer: Self-pay | Admitting: Urology

## 2020-02-02 ENCOUNTER — Ambulatory Visit: Payer: Self-pay | Admitting: Surgery

## 2020-02-11 ENCOUNTER — Encounter: Payer: Self-pay | Admitting: Surgery

## 2020-02-11 ENCOUNTER — Other Ambulatory Visit: Payer: Self-pay

## 2020-02-11 ENCOUNTER — Ambulatory Visit: Payer: Self-pay | Admitting: Surgery

## 2020-02-11 ENCOUNTER — Ambulatory Visit (INDEPENDENT_AMBULATORY_CARE_PROVIDER_SITE_OTHER): Payer: Medicare HMO | Admitting: Surgery

## 2020-02-11 ENCOUNTER — Telehealth: Payer: Self-pay | Admitting: Surgery

## 2020-02-11 VITALS — BP 124/79 | HR 77 | Temp 98.1°F | Resp 12 | Ht 72.0 in | Wt 199.8 lb

## 2020-02-11 DIAGNOSIS — K4020 Bilateral inguinal hernia, without obstruction or gangrene, not specified as recurrent: Secondary | ICD-10-CM

## 2020-02-11 NOTE — Telephone Encounter (Signed)
Outgoing call is made, left message to call.  Please advise patient of Pre-Admission date/time, COVID Testing date and Surgery date.  Surgery Date: 02/17/20 Preadmission Testing Date: 02/15/20 (phone 8a-1p) Covid Testing Date: 02/16/20 - patient [facility] [facility]) between 8a-1p   Also patient needs to call [phone], between 1-3:00pm the day before surgery, to find out what time to arrive for surgery.

## 2020-02-11 NOTE — Progress Notes (Signed)
Patient ID: Dan Li, male   DOB: 1956/03/29, 64 y.o.   MRN: [id number]  Chief Complaint: Right groin pain for 6 months.  History of Present Illness Dan Li is a 64 y.o. male with a history of drainage of a right scrotal hydrocele last November.  At that time he had CT scans and ultrasounds showing no indication/evidence of an associated hernia.  However he has since developed pain in the groin that occurs occasionally.  He has some persisting/recurrent swelling in the right groin that never goes away, it is significantly higher than his prior scrotal swelling/hydrocele.  He is referred for possible right inguinal hernia.  As the pain is worsened with lifting/straining.  He also port reports discomfort with coughing.  He denies any pain or bulge on the left side.  Past Medical History Past Medical History:  Diagnosis Date  . [name]       Past Surgical History:  Procedure Laterality Date  . HYDROCELE EXCISION Right 03/02/2019   Procedure: HYDROCELECTOMY ADULT;  Surgeon: Vanna Scotland, MD;  Location: [facility];  Service: Urology;  Laterality: Right;  . INCISION AND DRAINAGE ABSCESS Right 04/16/2019   Procedure: INCISION AND DRAINAGE scrotal seroma vs hematoma;  Surgeon: Vanna Scotland, MD;  Location: [facility];  Service: Urology;  Laterality: Right;  . NO PAST SURGERIES      Allergies  Allergen Reactions  . Thorazine [Chlorpromazine] Anaphylaxis    No current outpatient medications on file.   No current facility-administered medications for this visit.    Family History Family History  Problem Relation Age of Onset  . Bladder Cancer Neg Hx   . Prostate cancer Neg Hx   . Kidney cancer Neg Hx       Social History Social History   Tobacco Use  . Smoking status: Former Games developer  . Smokeless tobacco: Never Used  Substance Use Topics  . Alcohol use: Yes    Alcohol/week: 0.0 standard drinks    Comment: very rare  . Drug use: No        Review of  Systems  Constitutional: Negative.   HENT: Negative.   Eyes: Negative.   Respiratory: Negative.   Cardiovascular: Negative.   Gastrointestinal: Negative.   Genitourinary: Negative.   Skin: Negative.   Neurological: Negative.   Psychiatric/Behavioral: Negative.       Physical Exam Blood pressure 124/79, pulse 77, temperature 98.1 F (36.7 C), resp. rate 12, height 6' (1.829 m), weight 199 lb 12.8 oz (90.6 kg), SpO2 97 %. Last Weight  Most recent update: 02/11/2020 11:04 AM   Weight  90.6 kg (199 lb 12.8 oz)            CONSTITUTIONAL: Well developed, and nourished, appropriately responsive and aware without distress.   EYES: Sclera non-icteric.   EARS, NOSE, MOUTH AND THROAT: Mask worn.   Hearing is intact to voice.  NECK: Trachea is midline, and there is no jugular venous distension.  LYMPH NODES:  Lymph nodes in the neck are not enlarged. RESPIRATORY:  Lungs are clear, and breath sounds are equal bilaterally. Normal respiratory effort without pathologic use of accessory muscles. CARDIOVASCULAR: Heart is regular in rate and rhythm. GI: The abdomen is soft, nontender, and nondistended. There were no palpable masses. I did not appreciate hepatosplenomegaly. There were normal bowel sounds. GU: There is a right inguinal swelling that is clearly asymmetric.  There is significant motion with Valsalva, however the mass never goes away, however I sense it  is fluid-filled.  It is not reducible.  It is also not consistent with incarceration, as I cannot palpate any neck at the floor of the inguinal region.  There is a palpable descending sac on the left side with Valsalva. MUSCULOSKELETAL:  Symmetrical muscle tone appreciated in all four extremities.    SKIN: Skin turgor is normal. No pathologic skin lesions appreciated.  NEUROLOGIC:  Motor and sensation appear grossly normal.  Cranial nerves are grossly without defect. PSYCH:  Alert and oriented to person, place and time. Affect is  appropriate for situation.  Data Reviewed I have personally reviewed what is currently available of the patient's imaging, recent labs and medical records.   Labs:  CBC Latest Ref Rng & Units 04/16/2019  WBC 4.0 - 10.5 K/uL 5.7  Hemoglobin 13.0 - 17.0 g/dL 34.0  Hematocrit 39 - 52 % 43.3  Platelets 150 - 400 K/uL 228   CMP Latest Ref Rng & Units 04/16/2019 02/18/2019  Glucose 70 - 99 mg/dL 352(Y) -  BUN 8 - 23 mg/dL 16 -  Creatinine 8.18 - 1.24 mg/dL 5.90 9.31  Sodium 121 - 145 mmol/L 141 -  Potassium 3.5 - 5.1 mmol/L 4.2 -  Chloride 98 - 111 mmol/L 107 -  CO2 22 - 32 mmol/L 26 -  Calcium 8.9 - 10.3 mg/dL 9.0 -      Imaging: Radiology review: CT scan and ultrasound imaging reviewed from a year ago. Within last 24 hrs: No results found.  Assessment    Incomplete right sided hydrocele, with inguinal ring defect consistent with a right sided inguinal hernia.  Left inguinal hernia.  Patient Active Problem List   Diagnosis Date Noted  . Non-recurrent bilateral inguinal hernia without obstruction or gangrene 02/11/2020    Plan    Robotic bilateral inguinal hernia repairs.  I discussed the risk of surgery including recurrence, technique, and use of prosthetic materials (mesh) and the risk of infxn, and the risks of general anesthetic including MI, CVA, sudden death or even reaction to anesthetic medications. The patient and family understands the risks, any and all questions were answered to the patient's satisfaction.   Face-to-face time spent with the patient and accompanying care providers(if present) was 30 minutes, with more than 50% of the time spent counseling, educating, and coordinating care of the patient.      [name] M.D., FACS 02/11/2020, 1:42 PM

## 2020-02-11 NOTE — H&P (View-Only) (Signed)
Patient ID: Dan Li, male   DOB: 09/02/1955, 64 y.o.   MRN: [id number]  Chief Complaint: Right groin pain for 6 months.  History of Present Illness Dan Li is a 64 y.o. male with a history of drainage of a right scrotal hydrocele last November.  At that time he had CT scans and ultrasounds showing no indication/evidence of an associated hernia.  However he has since developed pain in the groin that occurs occasionally.  He has some persisting/recurrent swelling in the right groin that never goes away, it is significantly higher than his prior scrotal swelling/hydrocele.  He is referred for possible right inguinal hernia.  As the pain is worsened with lifting/straining.  He also port reports discomfort with coughing.  He denies any pain or bulge on the left side.  Past Medical History Past Medical History:  Diagnosis Date  . [name]       Past Surgical History:  Procedure Laterality Date  . HYDROCELE EXCISION Right 03/02/2019   Procedure: HYDROCELECTOMY ADULT;  Surgeon: Brandon, Ashley, MD;  Location: [facility];  Service: Urology;  Laterality: Right;  . INCISION AND DRAINAGE ABSCESS Right 04/16/2019   Procedure: INCISION AND DRAINAGE scrotal seroma vs hematoma;  Surgeon: Brandon, Ashley, MD;  Location: [facility];  Service: Urology;  Laterality: Right;  . NO PAST SURGERIES      Allergies  Allergen Reactions  . Thorazine [Chlorpromazine] Anaphylaxis    No current outpatient medications on file.   No current facility-administered medications for this visit.    Family History Family History  Problem Relation Age of Onset  . Bladder Cancer Neg Hx   . Prostate cancer Neg Hx   . Kidney cancer Neg Hx       Social History Social History   Tobacco Use  . Smoking status: Former Smoker  . Smokeless tobacco: Never Used  Substance Use Topics  . Alcohol use: Yes    Alcohol/week: 0.0 standard drinks    Comment: very rare  . Drug use: No        Review of  Systems  Constitutional: Negative.   HENT: Negative.   Eyes: Negative.   Respiratory: Negative.   Cardiovascular: Negative.   Gastrointestinal: Negative.   Genitourinary: Negative.   Skin: Negative.   Neurological: Negative.   Psychiatric/Behavioral: Negative.       Physical Exam Blood pressure 124/79, pulse 77, temperature 98.1 F (36.7 C), resp. rate 12, height 6' (1.829 m), weight 199 lb 12.8 oz (90.6 kg), SpO2 97 %. Last Weight  Most recent update: 02/11/2020 11:04 AM   Weight  90.6 kg (199 lb 12.8 oz)            CONSTITUTIONAL: Well developed, and nourished, appropriately responsive and aware without distress.   EYES: Sclera non-icteric.   EARS, NOSE, MOUTH AND THROAT: Mask worn.   Hearing is intact to voice.  NECK: Trachea is midline, and there is no jugular venous distension.  LYMPH NODES:  Lymph nodes in the neck are not enlarged. RESPIRATORY:  Lungs are clear, and breath sounds are equal bilaterally. Normal respiratory effort without pathologic use of accessory muscles. CARDIOVASCULAR: Heart is regular in rate and rhythm. GI: The abdomen is soft, nontender, and nondistended. There were no palpable masses. I did not appreciate hepatosplenomegaly. There were normal bowel sounds. GU: There is a right inguinal swelling that is clearly asymmetric.  There is significant motion with Valsalva, however the mass never goes away, however I sense it   is fluid-filled.  It is not reducible.  It is also not consistent with incarceration, as I cannot palpate any neck at the floor of the inguinal region.  There is a palpable descending sac on the left side with Valsalva. MUSCULOSKELETAL:  Symmetrical muscle tone appreciated in all four extremities.    SKIN: Skin turgor is normal. No pathologic skin lesions appreciated.  NEUROLOGIC:  Motor and sensation appear grossly normal.  Cranial nerves are grossly without defect. PSYCH:  Alert and oriented to person, place and time. Affect is  appropriate for situation.  Data Reviewed I have personally reviewed what is currently available of the patient's imaging, recent labs and medical records.   Labs:  CBC Latest Ref Rng & Units 04/16/2019  WBC 4.0 - 10.5 K/uL 5.7  Hemoglobin 13.0 - 17.0 g/dL 34.0  Hematocrit 39 - 52 % 43.3  Platelets 150 - 400 K/uL 228   CMP Latest Ref Rng & Units 04/16/2019 02/18/2019  Glucose 70 - 99 mg/dL 352(Y) -  BUN 8 - 23 mg/dL 16 -  Creatinine 8.18 - 1.24 mg/dL 5.90 9.31  Sodium 121 - 145 mmol/L 141 -  Potassium 3.5 - 5.1 mmol/L 4.2 -  Chloride 98 - 111 mmol/L 107 -  CO2 22 - 32 mmol/L 26 -  Calcium 8.9 - 10.3 mg/dL 9.0 -      Imaging: Radiology review: CT scan and ultrasound imaging reviewed from a year ago. Within last 24 hrs: No results found.  Assessment    Incomplete right sided hydrocele, with inguinal ring defect consistent with a right sided inguinal hernia.  Left inguinal hernia.  Patient Active Problem List   Diagnosis Date Noted  . Non-recurrent bilateral inguinal hernia without obstruction or gangrene 02/11/2020    Plan    Robotic bilateral inguinal hernia repairs.  I discussed the risk of surgery including recurrence, technique, and use of prosthetic materials (mesh) and the risk of infxn, and the risks of general anesthetic including MI, CVA, sudden death or even reaction to anesthetic medications. The patient and family understands the risks, any and all questions were answered to the patient's satisfaction.   Face-to-face time spent with the patient and accompanying care providers(if present) was 30 minutes, with more than 50% of the time spent counseling, educating, and coordinating care of the patient.      [name] M.D., FACS 02/11/2020, 1:42 PM

## 2020-02-11 NOTE — Patient Instructions (Signed)
Our surgery scheduler will contact you to schedule your surgery. Please have the Blue Sheet available when she calls you.   Call the office if you have any questions or concerns.  Inguinal Hernia, Adult An inguinal hernia develops when fat or the intestines push through a weak spot in a muscle where your leg meets your lower abdomen (groin). This creates a bulge. This kind of hernia could also be:  In your scrotum, if you are male.  In folds of skin around your vagina, if you are male. There are three types of inguinal hernias:  Hernias that can be pushed back into the abdomen (are reducible). This type rarely causes pain.  Hernias that are not reducible (are incarcerated).  Hernias that are not reducible and lose their blood supply (are strangulated). This type of hernia requires emergency surgery. What are the causes? This condition is caused by having a weak spot in the muscles or tissues in the groin. This weak spot develops over time. The hernia may poke through the weak spot when you suddenly strain your lower abdominal muscles, such as when you:  Lift a heavy object.  Strain to have a bowel movement. Constipation can lead to straining.  Cough. What increases the risk? This condition is more likely to develop in:  Men.  Pregnant women.  People who: ? Are overweight. ? Work in jobs that require long periods of standing or heavy lifting. ? Have had an inguinal hernia before. ? Smoke or have lung disease. These factors can lead to long-lasting (chronic) coughing. What are the signs or symptoms? Symptoms may depend on the size of the hernia. Often, a small inguinal hernia has no symptoms. Symptoms of a larger hernia may include:  A lump in the groin area. This is easier to see when standing. It might not be visible when lying down.  Pain or burning in the groin. This may get worse when lifting, straining, or coughing.  A dull ache or a feeling of pressure in the  groin.  In men, an unusual lump in the scrotum. Symptoms of a strangulated inguinal hernia may include:  A bulge in your groin that is very painful and tender to the touch.  A bulge that turns red or purple.  Fever, nausea, and vomiting.  Inability to have a bowel movement or to pass gas. How is this diagnosed? This condition is diagnosed based on your symptoms, your medical history, and a physical exam. Your health care provider may feel your groin area and ask you to cough. How is this treated? Treatment depends on the size of your hernia and whether you have symptoms. If you do not have symptoms, your health care provider may have you watch your hernia carefully and have you come in for follow-up visits. If your hernia is large or if you have symptoms, you may need surgery to repair the hernia. Follow these instructions at home: Lifestyle  Avoid lifting heavy objects.  Avoid standing for long periods of time.  Do not use any products that contain nicotine or tobacco, such as cigarettes and e-cigarettes. If you need help quitting, ask your health care provider.  Maintain a healthy weight. Preventing constipation  Take actions to prevent constipation. Constipation leads to straining with bowel movements, which can make a hernia worse or cause a hernia repair to break down. Your health care provider may recommend that you: ? Drink enough fluid to keep your urine pale yellow. ? Eat foods that are high   in fiber, such as fresh fruits and vegetables, whole grains, and beans. ? Limit foods that are high in fat and processed sugars, such as fried or sweet foods. ? Take an over-the-counter or prescription medicine for constipation. General instructions  You may try to push the hernia back in place by very gently pressing on it while lying down. Do not try to force the bulge back in if it will not push in easily.  Watch your hernia for any changes in shape, size, or color. Get help right  away if you notice any changes.  Take over-the-counter and prescription medicines only as told by your health care provider.  Keep all follow-up visits as told by your health care provider. This is important. Contact a health care provider if:  You have a fever.  You develop new symptoms.  Your symptoms get worse. Get help right away if:  You have pain in your groin that suddenly gets worse.  You have a bulge in your groin that: ? Suddenly gets bigger and does not get smaller. ? Becomes red or purple or painful to the touch.  You are a man and you have a sudden pain in your scrotum, or the size of your scrotum suddenly changes.  You cannot push the hernia back in place by very gently pressing on it when you are lying down. Do not try to force the bulge back in if it will not push in easily.  You have nausea or vomiting that does not go away.  You have a fast heartbeat.  You cannot have a bowel movement or pass gas. These symptoms may represent a serious problem that is an emergency. Do not wait to see if the symptoms will go away. Get medical help right away. Call your local emergency services (911 in the [place] Summary  An inguinal hernia develops when fat or the intestines push through a weak spot in a muscle where your leg meets your lower abdomen (groin).  This condition is caused by having a weak spot in muscles or tissue in your groin.  Symptoms may depend on the size of the hernia, and they may include pain or swelling in your groin. A small inguinal hernia often has no symptoms.  Treatment may not be needed if you do not have symptoms. If you have symptoms or a large hernia, you may need surgery to repair the hernia.  Avoid lifting heavy objects. Also avoid standing for long amounts of time. This information is not intended to replace advice given to you by your health care provider. Make sure you discuss any questions you have with your health care  provider. Document Revised: 05/18/2017 Document Reviewed: 01/16/2017 Elsevier Patient Education  2020 [employer]  

## 2020-02-15 ENCOUNTER — Other Ambulatory Visit
Admission: RE | Admit: 2020-02-15 | Discharge: 2020-02-15 | Disposition: A | Discharge status: Home / Self Care | Payer: Medicare HMO | Source: Ambulatory Visit | Attending: Surgery | Admitting: Surgery

## 2020-02-15 ENCOUNTER — Other Ambulatory Visit: Payer: Medicare HMO

## 2020-02-15 ENCOUNTER — Other Ambulatory Visit: Payer: Self-pay

## 2020-02-15 NOTE — Patient Instructions (Signed)
Your procedure is scheduled on: Wednesday February 17, 2020. Report to Day Surgery inside [facility] 2nd floor. To find out your arrival time please call [phone] between 1PM - 3PM on Tuesday February 16, 2020.  Remember: Instructions that are not followed completely may result in serious medical risk,  up to and including death, or upon the discretion of your surgeon and anesthesiologist your  surgery may need to be rescheduled.     _X__ 1. Do not eat food after midnight the night before your procedure.                 No chewing gum or hard candies. You may drink clear liquids up to 2 hours                 before you are scheduled to arrive for your surgery- DO not drink clear                 liquids within 2 hours of the start of your surgery.                 Clear Liquids include:  water, apple juice without pulp, clear Gatorade, G2 or                  Gatorade Zero (avoid Red/Purple/Blue), Black Coffee or Tea (Do not add                 anything to coffee or tea).  __X__2.  On the morning of surgery brush your teeth with toothpaste and water, you                may rinse your mouth with mouthwash if you wish.  Do not swallow any toothpaste of mouthwash.     _X__ 3.  No Alcohol for 24 hours before or after surgery.   _X__ 4.  Do Not Smoke or use e-cigarettes For 24 Hours Prior to Your Surgery.                 Do not use any chewable tobacco products for at least 6 hours prior to                 Surgery.  _X__  5.  Do not use any recreational drugs (marijuana, cocaine, heroin, ecstasy, MDMA or other)                For at least one week prior to your surgery.  Combination of these drugs with anesthesia                May have life threatening results.  __x__ 6.  Notify your doctor if there is any change in your medical condition      (cold, fever, infections).     Do not wear jewelry, make-up, hairpins, clips or nail polish. Do not wear lotions,  powders, or perfumes. You may wear deodorant. Do not shave 48 hours prior to surgery. Men may shave face and neck. Do not bring valuables to the hospital.    [facility] is not responsible for any belongings or valuables.  Contacts, dentures or bridgework may not be worn into surgery. Leave your suitcase in the car. After surgery it may be brought to your room. For patients admitted to the hospital, discharge time is determined by your treatment team.   Patients discharged the day of surgery will not be allowed to drive home.   Make arrangements for someone to be  with you for the first 24 hours of your Same Day Discharge.   __x__ Take these medicines the morning of surgery with A SIP OF WATER:    1. None    ____ Fleet Enema (as directed)   __x__ Use CHG Soap as directed  ____ Use Benzoyl Peroxide Gel as instructed  ____ Use inhalers on the day of surgery  __x__ Stop Anti-inflammatories such as Ibuprofen, Aleve, Advil, naproxen, aspirin and or BC powders.   __x__ Stop supplements until after surgery.    __x__ Do not start any herbal supplements before your procedure.    If you have any questions regarding your pre-procedure instructions,  Please call Pre-admit Testing at [phone].

## 2020-02-16 ENCOUNTER — Encounter
Admission: RE | Admit: 2020-02-16 | Discharge: 2020-02-16 | Disposition: A | Discharge status: Home / Self Care | Payer: Medicare HMO | Source: Ambulatory Visit | Attending: Surgery | Admitting: Surgery

## 2020-02-16 ENCOUNTER — Other Ambulatory Visit: Payer: Self-pay

## 2020-02-16 DIAGNOSIS — Z20822 Contact with and (suspected) exposure to covid-19: Secondary | ICD-10-CM | POA: Insufficient documentation

## 2020-02-16 DIAGNOSIS — Z01812 Encounter for preprocedural laboratory examination: Secondary | ICD-10-CM | POA: Insufficient documentation

## 2020-02-16 LAB — COMPREHENSIVE METABOLIC PANEL
ALT: 25 U/L (ref 0–44)
AST: 24 U/L (ref 15–41)
Albumin: 4.5 g/dL (ref 3.5–5.0)
Alkaline Phosphatase: 65 U/L (ref 38–126)
Anion gap: 10 (ref 5–15)
BUN: 20 mg/dL (ref 8–23)
CO2: 23 mmol/L (ref 22–32)
Calcium: 9.1 mg/dL (ref 8.9–10.3)
Chloride: 107 mmol/L (ref 98–111)
Creatinine, Ser: 1.13 mg/dL (ref 0.61–1.24)
GFR, Estimated: >60 mL/min (ref >60)
Glucose, Bld: 104 mg/dL — ABNORMAL HIGH (ref 70–99)
Potassium: 3.9 mmol/L (ref 3.5–5.1)
Sodium: 140 mmol/L (ref 135–145)
Total Bilirubin: 0.9 mg/dL (ref 0.3–1.2)
Total Protein: 6.7 g/dL (ref 6.5–8.1)

## 2020-02-16 LAB — CBC WITH DIFFERENTIAL/PLATELET
Abs Immature Granulocytes: 0.01 10*3/uL (ref 0.00–0.07)
Basophils Absolute: 0 10*3/uL (ref 0.0–0.1)
Basophils Relative: 1 %
Eosinophils Absolute: 0.1 10*3/uL (ref 0.0–0.5)
Eosinophils Relative: 2 %
HCT: 46.2 % (ref 39.0–52.0)
Hemoglobin: 15.7 g/dL (ref 13.0–17.0)
Immature Granulocytes: 0 %
Lymphocytes Relative: 31 %
Lymphs Abs: 1.3 10*3/uL (ref 0.7–4.0)
MCH: 28.3 pg (ref 26.0–34.0)
MCHC: 34 g/dL (ref 30.0–36.0)
MCV: 83.2 fL (ref 80.0–100.0)
Monocytes Absolute: 0.3 10*3/uL (ref 0.1–1.0)
Monocytes Relative: 7 %
Neutro Abs: 2.5 10*3/uL (ref 1.7–7.7)
Neutrophils Relative %: 59 %
Platelets: 182 10*3/uL (ref 150–400)
RBC: 5.55 MIL/uL (ref 4.22–5.81)
RDW: 13 % (ref 11.5–15.5)
WBC: 4.3 10*3/uL (ref 4.0–10.5)
nRBC: 0 % (ref 0.0–0.2)

## 2020-02-16 MED ORDER — GABAPENTIN 300 MG PO CAPS: 300.0000 mg | ORAL_CAPSULE | ORAL | Status: AC

## 2020-02-16 MED ORDER — FAMOTIDINE 20 MG PO TABS: 20.0000 mg | ORAL_TABLET | Freq: Once | ORAL | Status: AC

## 2020-02-16 MED ORDER — CELECOXIB 200 MG PO CAPS: 200.0000 mg | ORAL_CAPSULE | ORAL | Status: AC

## 2020-02-16 MED ORDER — ORAL CARE MOUTH RINSE: 15.0000 mL | Freq: Once | OROMUCOSAL | Status: AC

## 2020-02-16 MED ORDER — LACTATED RINGERS IV SOLN: INTRAVENOUS | Status: DC

## 2020-02-16 MED ORDER — CEFAZOLIN SODIUM-DEXTROSE 2-4 GM/100ML-% IV SOLN
2.0000 g | INTRAVENOUS | Status: AC
  Administered 2020-02-17: 2 g via INTRAVENOUS

## 2020-02-16 MED ORDER — ACETAMINOPHEN 500 MG PO TABS: 1000.0000 mg | ORAL_TABLET | ORAL | Status: AC

## 2020-02-16 MED ORDER — CHLORHEXIDINE GLUCONATE 0.12 % MT SOLN: 15.0000 mL | Freq: Once | OROMUCOSAL | Status: AC

## 2020-02-16 MED ORDER — BUPIVACAINE LIPOSOME 1.3 % IJ SUSP: 20.0000 mL | Freq: Once | INTRAMUSCULAR | Status: DC

## 2020-02-17 ENCOUNTER — Other Ambulatory Visit: Payer: Self-pay

## 2020-02-17 ENCOUNTER — Ambulatory Visit: Payer: Medicare HMO | Admitting: Anesthesiology

## 2020-02-17 ENCOUNTER — Encounter
Admission: RE | Disposition: A | Discharge status: Home / Self Care | Payer: Self-pay | Source: Home / Self Care | Attending: Surgery

## 2020-02-17 ENCOUNTER — Ambulatory Visit
Admission: RE | Admit: 2020-02-17 | Discharge: 2020-02-17 | Disposition: A | Discharge status: Home / Self Care | Payer: Medicare HMO | Attending: Surgery | Admitting: Surgery

## 2020-02-17 ENCOUNTER — Encounter: Payer: Self-pay | Admitting: Surgery

## 2020-02-17 DIAGNOSIS — K4020 Bilateral inguinal hernia, without obstruction or gangrene, not specified as recurrent: Secondary | ICD-10-CM | POA: Insufficient documentation

## 2020-02-17 DIAGNOSIS — Z87891 Personal history of nicotine dependence: Secondary | ICD-10-CM | POA: Insufficient documentation

## 2020-02-17 HISTORY — PX: XI ROBOTIC ASSISTED INGUINAL HERNIA REPAIR WITH MESH: SHX6706

## 2020-02-17 LAB — SARS CORONAVIRUS 2 (TAT 6-24 HRS): SARS Coronavirus 2: NEGATIVE

## 2020-02-17 SURGERY — REPAIR, HERNIA, INGUINAL, ROBOT-ASSISTED, LAPAROSCOPIC, USING MESH
Anesthesia: General | Laterality: Bilateral

## 2020-02-17 MED ORDER — HYDROCODONE-ACETAMINOPHEN 5-325 MG PO TABS: 1.0000 | ORAL_TABLET | Freq: Four times a day (QID) | ORAL | 0 refills | Status: DC | PRN

## 2020-02-17 MED ORDER — ONDANSETRON HCL 4 MG/2ML IJ SOLN: 4.0000 mg | Freq: Once | INTRAMUSCULAR | Status: DC | PRN

## 2020-02-17 MED ORDER — LIDOCAINE HCL (CARDIAC) PF 100 MG/5ML IV SOSY
PREFILLED_SYRINGE | INTRAVENOUS | Status: DC | PRN
  Administered 2020-02-17: 60 mg via INTRAVENOUS

## 2020-02-17 MED ORDER — CEFAZOLIN SODIUM-DEXTROSE 2-4 GM/100ML-% IV SOLN
INTRAVENOUS | Status: AC
  Filled 2020-02-17: qty 100

## 2020-02-17 MED ORDER — MIDAZOLAM HCL 2 MG/2ML IJ SOLN
INTRAMUSCULAR | Status: DC | PRN
  Administered 2020-02-17: 2 mg via INTRAVENOUS

## 2020-02-17 MED ORDER — CHLORHEXIDINE GLUCONATE CLOTH 2 % EX PADS
6.0000 | MEDICATED_PAD | Freq: Once | CUTANEOUS | Status: AC
  Administered 2020-02-17: 6 via TOPICAL

## 2020-02-17 MED ORDER — CHLORHEXIDINE GLUCONATE 0.12 % MT SOLN
OROMUCOSAL | Status: AC
  Administered 2020-02-17: 15 mL via OROMUCOSAL
  Filled 2020-02-17: qty 15

## 2020-02-17 MED ORDER — BUPIVACAINE-EPINEPHRINE (PF) 0.25% -1:200000 IJ SOLN
INTRAMUSCULAR | Status: DC | PRN
  Administered 2020-02-17: 26 mL via PERINEURAL

## 2020-02-17 MED ORDER — EPHEDRINE SULFATE 50 MG/ML IJ SOLN
INTRAMUSCULAR | Status: DC | PRN
  Administered 2020-02-17: 10 mg via INTRAVENOUS

## 2020-02-17 MED ORDER — KETAMINE HCL 10 MG/ML IJ SOLN
INTRAMUSCULAR | Status: DC | PRN
  Administered 2020-02-17: 10 mg via INTRAVENOUS
  Administered 2020-02-17: 30 mg via INTRAVENOUS

## 2020-02-17 MED ORDER — PROPOFOL 10 MG/ML IV BOLUS
INTRAVENOUS | Status: AC
  Filled 2020-02-17: qty 40

## 2020-02-17 MED ORDER — FENTANYL CITRATE (PF) 100 MCG/2ML IJ SOLN
25.0000 ug | INTRAMUSCULAR | Status: DC | PRN
  Administered 2020-02-17 (×3): 25 ug via INTRAVENOUS

## 2020-02-17 MED ORDER — FAMOTIDINE 20 MG PO TABS
ORAL_TABLET | ORAL | Status: AC
  Administered 2020-02-17: 20 mg via ORAL
  Filled 2020-02-17: qty 1

## 2020-02-17 MED ORDER — GABAPENTIN 300 MG PO CAPS
ORAL_CAPSULE | ORAL | Status: AC
  Administered 2020-02-17: 300 mg via ORAL
  Filled 2020-02-17: qty 1

## 2020-02-17 MED ORDER — FENTANYL CITRATE (PF) 100 MCG/2ML IJ SOLN
INTRAMUSCULAR | Status: AC
Start: 2020-02-17 — End: 2020-02-17
  Administered 2020-02-17: 25 ug via INTRAVENOUS
  Filled 2020-02-17: qty 2

## 2020-02-17 MED ORDER — SEVOFLURANE IN SOLN
RESPIRATORY_TRACT | Status: AC
  Filled 2020-02-17: qty 250

## 2020-02-17 MED ORDER — IBUPROFEN 800 MG PO TABS: 800.0000 mg | ORAL_TABLET | Freq: Three times a day (TID) | ORAL | 0 refills | Status: DC | PRN

## 2020-02-17 MED ORDER — FENTANYL CITRATE (PF) 100 MCG/2ML IJ SOLN
INTRAMUSCULAR | Status: AC
  Filled 2020-02-17: qty 2

## 2020-02-17 MED ORDER — SUGAMMADEX SODIUM 200 MG/2ML IV SOLN
INTRAVENOUS | Status: DC | PRN
  Administered 2020-02-17: 200 mg via INTRAVENOUS

## 2020-02-17 MED ORDER — KETAMINE HCL 50 MG/ML IJ SOLN
INTRAMUSCULAR | Status: AC
  Filled 2020-02-17: qty 10

## 2020-02-17 MED ORDER — PHENYLEPHRINE HCL (PRESSORS) 10 MG/ML IV SOLN
INTRAVENOUS | Status: AC
  Filled 2020-02-17: qty 1

## 2020-02-17 MED ORDER — FENTANYL CITRATE (PF) 100 MCG/2ML IJ SOLN
INTRAMUSCULAR | Status: DC | PRN
Start: 2020-02-17 — End: 2020-02-17
  Administered 2020-02-17 (×2): 50 ug via INTRAVENOUS

## 2020-02-17 MED ORDER — BUPIVACAINE-EPINEPHRINE (PF) 0.25% -1:200000 IJ SOLN
INTRAMUSCULAR | Status: AC
  Filled 2020-02-17: qty 30

## 2020-02-17 MED ORDER — BUPIVACAINE LIPOSOME 1.3 % IJ SUSP
INTRAMUSCULAR | Status: DC | PRN
  Administered 2020-02-17: 20 mL

## 2020-02-17 MED ORDER — DEXAMETHASONE SODIUM PHOSPHATE 10 MG/ML IJ SOLN
INTRAMUSCULAR | Status: AC
  Filled 2020-02-17: qty 1

## 2020-02-17 MED ORDER — ACETAMINOPHEN 500 MG PO TABS
ORAL_TABLET | ORAL | Status: AC
  Administered 2020-02-17: 1000 mg via ORAL
  Filled 2020-02-17: qty 2

## 2020-02-17 MED ORDER — PHENYLEPHRINE HCL (PRESSORS) 10 MG/ML IV SOLN
INTRAVENOUS | Status: DC | PRN
  Administered 2020-02-17: 100 ug via INTRAVENOUS

## 2020-02-17 MED ORDER — LIDOCAINE HCL (PF) 2 % IJ SOLN
INTRAMUSCULAR | Status: AC
  Filled 2020-02-17: qty 5

## 2020-02-17 MED ORDER — ROCURONIUM BROMIDE 10 MG/ML (PF) SYRINGE
PREFILLED_SYRINGE | INTRAVENOUS | Status: AC
  Filled 2020-02-17: qty 10

## 2020-02-17 MED ORDER — DEXAMETHASONE SODIUM PHOSPHATE 10 MG/ML IJ SOLN
INTRAMUSCULAR | Status: DC | PRN
  Administered 2020-02-17: 10 mg via INTRAVENOUS

## 2020-02-17 MED ORDER — ONDANSETRON HCL 4 MG/2ML IJ SOLN
INTRAMUSCULAR | Status: AC
  Filled 2020-02-17: qty 2

## 2020-02-17 MED ORDER — CELECOXIB 200 MG PO CAPS
ORAL_CAPSULE | ORAL | Status: AC
  Administered 2020-02-17: 200 mg via ORAL
  Filled 2020-02-17: qty 1

## 2020-02-17 MED ORDER — ROCURONIUM BROMIDE 100 MG/10ML IV SOLN
INTRAVENOUS | Status: DC | PRN
  Administered 2020-02-17: 30 mg via INTRAVENOUS
  Administered 2020-02-17: 50 mg via INTRAVENOUS
  Administered 2020-02-17: 20 mg via INTRAVENOUS

## 2020-02-17 MED ORDER — MIDAZOLAM HCL 2 MG/2ML IJ SOLN
INTRAMUSCULAR | Status: AC
  Filled 2020-02-17: qty 2

## 2020-02-17 MED ORDER — SODIUM CHLORIDE 0.9 % IV SOLN
INTRAVENOUS | Status: DC | PRN
  Administered 2020-02-17: 20 ug/min via INTRAVENOUS

## 2020-02-17 MED ORDER — BUPIVACAINE LIPOSOME 1.3 % IJ SUSP
INTRAMUSCULAR | Status: AC
  Filled 2020-02-17: qty 20

## 2020-02-17 MED ORDER — PROPOFOL 10 MG/ML IV BOLUS
INTRAVENOUS | Status: DC | PRN
  Administered 2020-02-17: 150 mg via INTRAVENOUS

## 2020-02-17 MED ORDER — GLYCOPYRROLATE 0.2 MG/ML IJ SOLN
INTRAMUSCULAR | Status: DC | PRN
  Administered 2020-02-17: .2 mg via INTRAVENOUS

## 2020-02-17 MED ORDER — ONDANSETRON HCL 4 MG/2ML IJ SOLN
INTRAMUSCULAR | Status: DC | PRN
  Administered 2020-02-17: 4 mg via INTRAVENOUS

## 2020-02-17 MED ORDER — CHLORHEXIDINE GLUCONATE CLOTH 2 % EX PADS: 6.0000 | MEDICATED_PAD | Freq: Once | CUTANEOUS | Status: DC

## 2020-02-17 SURGICAL SUPPLY — 45 items
ADH SKN CLS APL DERMABOND .7 (GAUZE/BANDAGES/DRESSINGS) ×1
APL PRP STRL LF DISP 70% ISPRP (MISCELLANEOUS) ×1
BLADE CLIPPER SURG (BLADE) ×2 IMPLANT
CANISTER SUCT 1200ML W/VALVE (MISCELLANEOUS) ×2 IMPLANT
CHLORAPREP W/TINT 26 (MISCELLANEOUS) ×2 IMPLANT
COVER TIP SHEARS 8 DVNC (MISCELLANEOUS) ×1 IMPLANT
COVER TIP SHEARS 8MM DA VINCI (MISCELLANEOUS) ×1
COVER WAND RF STERILE (DRAPES) ×4 IMPLANT
DEFOGGER SCOPE WARMER CLEARIFY (MISCELLANEOUS) ×4 IMPLANT
DERMABOND ADVANCED (GAUZE/BANDAGES/DRESSINGS) ×1
DERMABOND ADVANCED .7 DNX12 (GAUZE/BANDAGES/DRESSINGS) ×1 IMPLANT
DRAPE 3/4 80X56 (DRAPES) ×2 IMPLANT
DRAPE ARM DVNC X/XI (DISPOSABLE) ×3 IMPLANT
DRAPE COLUMN DVNC XI (DISPOSABLE) ×1 IMPLANT
DRAPE DA VINCI XI ARM (DISPOSABLE) ×3
DRAPE DA VINCI XI COLUMN (DISPOSABLE) ×1
ELECT REM PT RETURN 9FT ADLT (ELECTROSURGICAL) ×2
ELECTRODE REM PT RTRN 9FT ADLT (ELECTROSURGICAL) ×1 IMPLANT
GLOVE ORTHO TXT STRL SZ7.5 (GLOVE) ×6 IMPLANT
GOWN STRL REUS W/ TWL LRG LVL3 (GOWN DISPOSABLE) ×3 IMPLANT
GOWN STRL REUS W/TWL LRG LVL3 (GOWN DISPOSABLE) ×6
GRASPER SUT TROCAR 14GX15 (MISCELLANEOUS) IMPLANT
IRRIGATION STRYKERFLOW (MISCELLANEOUS) IMPLANT
IRRIGATOR STRYKERFLOW (MISCELLANEOUS)
IV CATH ANGIO 14GX1.88 NO SAFE (IV SOLUTION) ×2 IMPLANT
IV NS 1000ML (IV SOLUTION)
IV NS 1000ML BAXH (IV SOLUTION) IMPLANT
KIT PINK PAD W/HEAD ARE REST (MISCELLANEOUS) ×2
KIT PINK PAD W/HEAD ARM REST (MISCELLANEOUS) ×1 IMPLANT
LABEL OR SOLS (LABEL) ×2 IMPLANT
MESH 3DMAX LIGHT 4.1X6.2 LT LR (Mesh General) ×2 IMPLANT
MESH 3DMAX LIGHT 4.1X6.2 RT LR (Mesh General) ×2 IMPLANT
NEEDLE HYPO 22GX1.5 SAFETY (NEEDLE) ×2 IMPLANT
NEEDLE INSUFFLATION 14GA 120MM (NEEDLE) IMPLANT
PACK LAP CHOLECYSTECTOMY (MISCELLANEOUS) ×2 IMPLANT
SEAL CANN UNIV 5-8 DVNC XI (MISCELLANEOUS) ×3 IMPLANT
SEAL XI 5MM-8MM UNIVERSAL (MISCELLANEOUS) ×3
SET TUBE SMOKE EVAC HIGH FLOW (TUBING) ×2 IMPLANT
SOLUTION ELECTROLUBE (MISCELLANEOUS) ×2 IMPLANT
SUT MNCRL 4-0 (SUTURE) ×2
SUT MNCRL 4-0 27XMFL (SUTURE) ×1
SUT VIC AB 0 CT2 27 (SUTURE) ×2 IMPLANT
SUT VLOC 90 S/L VL9 GS22 (SUTURE) ×4 IMPLANT
SUTURE MNCRL 4-0 27XMF (SUTURE) ×1 IMPLANT
TROCAR Z-THREAD FIOS 11X100 BL (TROCAR) ×2 IMPLANT

## 2020-02-17 NOTE — Op Note (Signed)
Robotic assisted Laparoscopic Transabdominal  bilateral inguinal Hernia Repair with Mesh       Pre-operative Diagnosis:   Bilateral inguinal Hernias   Post-operative Diagnosis: Same   Procedure: Robotic assisted Laparoscopic  repair of bilateral inguinal hernia(s)   Surgeon: [name], M.D., FACS   Anesthesia: GETA   Findings:  Bilateral direct inguinal hernias.  Recurrent right hydrocele noncommunicating.   Procedure Details  The patient was seen again in the Holding Room. The benefits, complications, treatment options, and expected outcomes were discussed with the patient. The risks of bleeding, infection, recurrence of symptoms, failure to resolve symptoms, recurrence of hernia, ischemic orchitis, chronic pain syndrome or neuroma, were reviewed again. The likelihood of improving the patient's symptoms with return to their baseline status is good.  The patient and/or family concurred with the proposed plan, giving informed consent.  The patient was taken to Operating Room, identified  and the procedure verified as Laparoscopic Inguinal Hernia Repair. Laterality confirmed.  A Time Out was held and the above information confirmed.   Prior to the induction of general anesthesia, antibiotic prophylaxis was administered. VTE prophylaxis was in place. General endotracheal anesthesia was then administered and tolerated well. After the induction, the abdomen was prepped with Chloraprep and draped in the sterile fashion. The patient was positioned in the supine position.   After local infiltration of quarter percent Marcaine with epinephrine, stab incision was made left upper quadrant.  Just below the costal margin approximately midclavicular line the Veress needle is passed with sensation of the layers to penetrate the abdominal wall and into the peritoneum.  Saline drop test is confirmed peritoneal placement.  Insufflation is initiated with carbon dioxide to pressures of 15 mmHg. An 8.5 mm  port is placed to the left off of the midline, with blunt tipped trocar.  Pneumoperitoneum maintained w/o HD changes. No evidence of bowel injuries.  Two 8.5 mm ports placed under direct vision. The laparoscopy revealed bilateral, direct and smaller indirect defect(s).   The robot was brought ot the table and docked in the standard fashion, no collision between arms was observed. Instruments were kept under direct view at all times. For bilateral inguinal hernia repair,  I developed a peritoneal flap. The sac(s) were reduced and dissected free from adjacent structures. We preserved the vas and the vessels, and visualized them to their convergence and beyond in the retroperitoneum. Once dissection was completed a large left sided BARD 3D Light mesh was placed and secured at three points with interrupted 2-0 Vicryl to the pubic tubercle and anteriorly. There was good coverage of the direct, indirect and femoral spaces.  Second look revealed no complications or injuries. Attention then was turned to the opposite side. The sac was also reduced and dissected free from adjacent structures. We preserved the vas and the vessels, in like manner with adequate posterior dissection. Once dissection was completed the same, but contralateral mesh was placed and secured in like manner with interrupted 2-0 Vicryl. There was good coverage of the direct, indirect and femoral spaces.  The flap was then closed with 3-0 V-lock sutures.  Peritoneal closure without defects, after closure of small openings with 2-0 Vicryl.  Once assuring that hemostasis was adequate, all needles/sponges removed, and the robot was undocked.  A large angiocath is placed under direct visualization in the groin to reduce trapped extraperitoneal/scrotal air and confirm adequate peritoneal closure.  The ports were removed, the abdomen desulflated.  4-0 subcuticular Monocryl was used at all skin edges. Dermabond  was placed.  Then placed the  14-gauge Angiocath after sterile prep into the residual hydrocele on the right and drained completely, then infiltrated Exparel into the soft tissues. Patient tolerated the procedure well. There were no complications. He was taken to the recovery room in stable condition.           [name], M.D., FACS 02/17/2020, 11:07 AM

## 2020-02-17 NOTE — Anesthesia Preprocedure Evaluation (Signed)
Anesthesia Evaluation  Patient identified by MRN, date of birth, ID band Patient awake    Reviewed: Allergy & Precautions, H&P , NPO status , Patient's Chart, lab work & pertinent test results, reviewed documented beta blocker date and time   Airway Mallampati: II  TM Distance: >3 FB Neck ROM: full  Mouth opening: Limited Mouth Opening  Dental  (+) Teeth Intact, Chipped   Pulmonary neg pulmonary ROS, former smoker,    Pulmonary exam normal        Cardiovascular Exercise Tolerance: Good negative cardio ROS Normal cardiovascular exam Rhythm:regular Rate:Normal     Neuro/Psych negative neurological ROS  negative psych ROS   GI/Hepatic negative GI ROS, Neg liver ROS,   Endo/Other  negative endocrine ROS  Renal/GU negative Renal ROS  negative genitourinary   Musculoskeletal   Abdominal   Peds  Hematology negative hematology ROS (+)   Anesthesia Other Findings Past Medical History: No date: [name] Past Surgical History: 03/02/2019: HYDROCELE EXCISION; Right     Comment:  Procedure: HYDROCELECTOMY ADULT;  Surgeon: Vanna Scotland, MD;  Location: [facility] ORS;  Service: Urology;                Laterality: Right; 04/16/2019: INCISION AND DRAINAGE ABSCESS; Right     Comment:  Procedure: INCISION AND DRAINAGE scrotal seroma vs               hematoma;  Surgeon: Vanna Scotland, MD;  Location: [facility]               ORS;  Service: Urology;  Laterality: Right; No date: NO PAST SURGERIES   Reproductive/Obstetrics negative OB ROS                             Anesthesia Physical Anesthesia Plan  ASA: II  Anesthesia Plan: General ETT   Post-op Pain Management:    Induction:   PONV Risk Score and Plan: 3  Airway Management Planned:   Additional Equipment:   Intra-op Plan:   Post-operative Plan:   Informed Consent: I have reviewed the patients History and Physical,  chart, labs and discussed the procedure including the risks, benefits and alternatives for the proposed anesthesia with the patient or authorized representative who has indicated his/her understanding and acceptance.     Dental Advisory Given  Plan Discussed with: CRNA  Anesthesia Plan Comments:         Anesthesia Quick Evaluation

## 2020-02-17 NOTE — Interval H&P Note (Signed)
No changes

## 2020-02-17 NOTE — OR Nursing (Signed)
Dr Claudine Mouton in to see patient assessed wound on right thumb discussed plan of care. Erby Pian RN notified of plan.

## 2020-02-17 NOTE — Anesthesia Procedure Notes (Addendum)
Procedure Name: Intubation Performed by: Ambrose Finland, RN Pre-anesthesia Checklist: Patient identified, Emergency Drugs available, Suction available and Patient being monitored Patient Re-evaluated:Patient Re-evaluated prior to induction Oxygen Delivery Method: Circle system utilized Preoxygenation: Pre-oxygenation with 100% oxygen Induction Type: IV induction Ventilation: Mask ventilation without difficulty Laryngoscope Size: McGraph and 3 Grade View: Grade I Tube type: Oral Tube size: 7.0 mm Number of attempts: 1 Airway Equipment and Method: Stylet and Oral airway Placement Confirmation: ETT inserted through vocal cords under direct vision,  positive ETCO2,  breath sounds checked- equal and bilateral and CO2 detector Secured at: 21 cm Tube secured with: Tape Dental Injury: Teeth and Oropharynx as per pre-operative assessment

## 2020-02-17 NOTE — Discharge Instructions (Signed)

## 2020-02-17 NOTE — Transfer of Care (Signed)
Immediate Anesthesia Transfer of Care Note  Patient: Dan Li  Procedure(s) Performed: XI ROBOTIC ASSISTED INGUINAL HERNIA REPAIR WITH MESH (Bilateral )  Patient Location: PACU  Anesthesia Type:General  Level of Consciousness: awake, drowsy and patient cooperative  Airway & Oxygen Therapy: Patient Spontanous Breathing and Patient connected to face mask oxygen  Post-op Assessment: Report given to RN and Post -op Vital signs reviewed and stable  Post vital signs: Reviewed and stable  Last Vitals:  Vitals Value Taken Time  BP 116/74 02/17/20 1100  Temp    Pulse 77 02/17/20 1108  Resp 14 02/17/20 1108  SpO2 98 % 02/17/20 1108  Vitals shown include unvalidated device data.  Last Pain:  Vitals:   02/17/20 0708  TempSrc: Temporal  PainSc: 1          Complications: No complications documented.

## 2020-02-18 ENCOUNTER — Encounter: Payer: Self-pay | Admitting: Surgery

## 2020-03-03 ENCOUNTER — Other Ambulatory Visit: Payer: Self-pay

## 2020-03-03 ENCOUNTER — Ambulatory Visit (INDEPENDENT_AMBULATORY_CARE_PROVIDER_SITE_OTHER): Payer: Medicare HMO | Admitting: Surgery

## 2020-03-03 ENCOUNTER — Encounter: Payer: Self-pay | Admitting: Surgery

## 2020-03-03 VITALS — BP 136/83 | HR 71 | Temp 97.7°F | Resp 12 | Ht 72.0 in | Wt 203.2 lb

## 2020-03-03 DIAGNOSIS — Z8719 Personal history of other diseases of the digestive system: Secondary | ICD-10-CM

## 2020-03-03 DIAGNOSIS — Z9889 Other specified postprocedural states: Secondary | ICD-10-CM

## 2020-03-03 NOTE — Progress Notes (Signed)
[facility] POST-OP OFFICE VISIT  03/03/2020  HPI: Dan Li is a 64 y.o. male 15 days s/p robotic bilateral inguinal hernia repair.  Patient has no complaints today.  Expected/anticipated postoperative discomfort/sensations resolved within a few days.  He denies any groin swelling.  Has progressively increased his activity level with lifting, walking etc..  Vital signs: BP 136/83   Pulse 71   Temp 97.7 F (36.5 C)   Resp 12   Ht 6' (1.829 m)   Wt 203 lb 3.2 oz (92.2 kg)   SpO2 97%   BMI 27.56 kg/m    Physical Exam: Constitutional: Appears well. Abdomen: Incisions, clean dry and intact.  Abdomen benign soft nontender.  No groin swelling, ecchymosis or tenderness.   Assessment/Plan: This is a 64 y.o. male 15 days s/p robotic bilateral inguinal hernias.  Patient Active Problem List   Diagnosis Date Noted  . Non-recurrent bilateral inguinal hernia without obstruction or gangrene 02/11/2020    -Progressive return to full activity, unrestricted after 2 more weeks.  Follow-up as needed.   [name] M.D., FACS 03/03/2020, 10:25 AM

## 2020-03-03 NOTE — Patient Instructions (Signed)
Follow up as needed, call the office if you have any questions or concerns.   GENERAL POST-OPERATIVE PATIENT INSTRUCTIONS   WOUND CARE INSTRUCTIONS:  Keep a dry clean dressing on the wound if there is drainage. The initial bandage may be removed after 24 hours.  Once the wound has quit draining you may leave it open to air.  If clothing rubs against the wound or causes irritation and the wound is not draining you may cover it with a dry dressing during the daytime.  Try to keep the wound dry and avoid ointments on the wound unless directed to do so.  If the wound becomes bright red and painful or starts to drain infected material that is not clear, please contact your physician immediately.  If the wound is mildly pink and has a thick firm ridge underneath it, this is normal, and is referred to as a healing ridge.  This will resolve over the next 4-6 weeks.  BATHING: You may shower if you have been informed of this by your surgeon. However, Please do not submerge in a tub, hot tub, or pool until incisions are completely sealed or have been told by your surgeon that you may do so.  DIET:  You may eat any foods that you can tolerate.  It is a good idea to eat a high fiber diet and take in plenty of fluids to prevent constipation.  If you do become constipated you may want to take a mild laxative or take ducolax tablets on a daily basis until your bowel habits are regular.  Constipation can be very uncomfortable, along with straining, after recent surgery.  ACTIVITY:  You are encouraged to cough and deep breath or use your incentive spirometer if you were given one, every 15-30 minutes when awake.  This will help prevent respiratory complications and low grade fevers post-operatively if you had a general anesthetic.  You may want to hug a pillow when coughing and sneezing to add additional support to the surgical area, if you had abdominal or chest surgery, which will decrease pain during these times.  You  are encouraged to walk and engage in light activity for the next two weeks.  You should not lift more than 10-15 pounds, 4-6 weeks after surgery as it could put you at increased risk for complications.  Twenty pounds is roughly equivalent to a plastic bag of groceries. At that time- Listen to your body when lifting, if you have pain when lifting, stop and then try again in a few days. Soreness after doing exercises or activities of daily living is normal as you get back in to your normal routine.  MEDICATIONS:  Try to take narcotic medications and anti-inflammatory medications, such as tylenol, ibuprofen, naprosyn, etc., with food.  This will minimize stomach upset from the medication.  Should you develop nausea and vomiting from the pain medication, or develop a rash, please discontinue the medication and contact your physician.  You should not drive, make important decisions, or operate machinery when taking narcotic pain medication.  SUNBLOCK Use sun block to incision area over the next year if this area will be exposed to sun. This helps decrease scarring and will allow you avoid a permanent darkened area over your incision.  QUESTIONS:  Please feel free to call our office if you have any questions, and we will be glad to assist you.     

## 2020-03-07 NOTE — Anesthesia Postprocedure Evaluation (Signed)
Anesthesia Post Note  Patient: Dan Li  Procedure(s) Performed: XI ROBOTIC ASSISTED INGUINAL HERNIA REPAIR WITH MESH (Bilateral )  Patient location during evaluation: PACU Anesthesia Type: General Level of consciousness: awake and alert Pain management: pain level controlled Vital Signs Assessment: post-procedure vital signs reviewed and stable Respiratory status: spontaneous breathing, nonlabored ventilation, respiratory function stable and patient connected to nasal cannula oxygen Cardiovascular status: blood pressure returned to baseline and stable Postop Assessment: no apparent nausea or vomiting Anesthetic complications: no   No complications documented.   Last Vitals:  Vitals:   02/17/20 1206 02/17/20 1327  BP: 118/70 126/68  Pulse: (!) 55 66  Resp: 20 18  Temp: 36.5 C   SpO2: 98% 100%    Last Pain:  Vitals:   02/18/20 0805  TempSrc:   PainSc: 0-No pain                 Yevette Edwards

## 2020-04-07 ENCOUNTER — Ambulatory Visit: Payer: Medicare HMO | Admitting: Urology

## 2020-04-20 ENCOUNTER — Ambulatory Visit (INDEPENDENT_AMBULATORY_CARE_PROVIDER_SITE_OTHER): Payer: Medicare HMO | Admitting: Urology

## 2020-04-20 ENCOUNTER — Other Ambulatory Visit: Payer: Self-pay

## 2020-04-20 ENCOUNTER — Encounter: Payer: Self-pay | Admitting: Urology

## 2020-04-20 VITALS — BP 145/78 | HR 73 | Ht 72.0 in | Wt 200.0 lb

## 2020-04-20 DIAGNOSIS — N5089 Other specified disorders of the male genital organs: Secondary | ICD-10-CM

## 2020-04-20 LAB — URINALYSIS, COMPLETE
Bilirubin, UA: NEGATIVE
Glucose, UA: NEGATIVE
Ketones, UA: NEGATIVE
Leukocytes,UA: NEGATIVE
Nitrite, UA: NEGATIVE
Protein,UA: NEGATIVE
RBC, UA: NEGATIVE
Specific Gravity, UA: 1.02 (ref 1.005–1.030)
Urobilinogen, Ur: 0.2 mg/dL (ref 0.2–1.0)
pH, UA: 6 (ref 5.0–7.5)

## 2020-04-20 LAB — MICROSCOPIC EXAMINATION: Bacteria, UA: NONE SEEN

## 2020-04-21 ENCOUNTER — Encounter: Payer: Self-pay | Admitting: Urology

## 2020-04-21 NOTE — Progress Notes (Signed)
04/20/2020 7:01 PM   Dan Li 12-19-1955 [id number]  Referring provider: [name], MD [phone] [facility] [address] [facility] [place],  [place] [address]  Chief Complaint  Patient presents with  . Groin Swelling    HPI: 64 y.o. male presents for right hemiscrotal swelling.  Refer to my prior note 01/22/2020.   Right hydrocelectomy by Dr. Apolinar Junes 03/02/2019  Postop accumulation of seroma versus hematoma  Return to the OR 12/17 for reexploration and drainage of reaccumulated fluid with drain placement  Was seen by me 9/24 with recurrent swelling in the right upper scrotum felt to be either a cord hydrocele or hernia  Was evaluated by Dr. Claudine Mouton and felt to have bilateral inguinal hernias  Underwent robotic assisted laparoscopic bilateral inguinal hernia repair 02/17/2020 and found to also have a cord hydrocele which was aspirated  He has done well but recently noted reaccumulated fluid  He does not desire additional surgery but is interested in repeat aspiration  PMH: Past Medical History:  Diagnosis Date  . 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 HYDROCELE EXCISION Right 03/02/2019   Procedure: HYDROCELECTOMY ADULT;  Surgeon: Vanna Scotland, MD;  Location: [facility] ORS;  Service: Urology;  Laterality: Right;  . INCISION AND DRAINAGE ABSCESS Right 04/16/2019   Procedure: INCISION AND DRAINAGE scrotal seroma vs hematoma;  Surgeon: Vanna Scotland, MD;  Location: [facility] ORS;  Service: Urology;  Laterality: Right;  . NO PAST SURGERIES    . XI ROBOTIC ASSISTED INGUINAL HERNIA REPAIR WITH MESH Bilateral 02/17/2020   Procedure: XI ROBOTIC ASSISTED INGUINAL HERNIA REPAIR WITH MESH;  Surgeon: [name], MD;  Location: [facility] ORS;  Service: General;  Laterality: Bilateral;    Home Medications:  Allergies as of 04/20/2020      Reactions   Thorazine [chlorpromazine] Anaphylaxis       Medication List    as of April 20, 2020 11:59 PM   You have not been prescribed any medications.     Allergies:  Allergies  Allergen Reactions  . Thorazine [Chlorpromazine] Anaphylaxis    Family History: Family History  Problem Relation Age of Onset  . Bladder Cancer Neg Hx   . Prostate cancer Neg Hx   . Kidney cancer Neg Hx     Social History:  reports that he has quit smoking. He has never used smokeless tobacco. He reports current alcohol use. He reports that he does not use drugs.   Physical Exam: BP (!) 145/78   Pulse 73   Ht 6' (1.829 m)   Wt 200 lb (90.7 kg)   BMI 27.12 kg/m   Constitutional:  Alert and oriented, No acute distress. HEENT: [place] AT, moist mucus membranes.  Trachea midline, no masses. Cardiovascular: No clubbing, cyanosis, or edema. Respiratory: Normal respiratory effort, no increased work of breathing. GI: Abdomen is soft, nontender, nondistended, no abdominal masses GU: Testes descended bilaterally without masses or tenderness.  4 cm smooth, spherical, firm upper scrotal mass superior to the testis near the external inguinal ring.  Nontender Skin: No rashes, bruises or suspicious lesions. Neurologic: Grossly intact, no focal deficits, moving all 4 extremities. Psychiatric: Normal mood and affect.   Assessment & Plan:    1. 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

## 2020-05-12 ENCOUNTER — Other Ambulatory Visit: Payer: Self-pay

## 2020-05-12 ENCOUNTER — Ambulatory Visit
Admission: RE | Admit: 2020-05-12 | Discharge: 2020-05-12 | Disposition: A | Discharge status: Home / Self Care | Payer: Medicare HMO | Source: Ambulatory Visit | Attending: Urology | Admitting: Urology

## 2020-05-12 DIAGNOSIS — N5089 Other specified disorders of the male genital organs: Secondary | ICD-10-CM | POA: Insufficient documentation

## 2020-05-17 ENCOUNTER — Telehealth: Payer: Self-pay | Admitting: *Deleted

## 2020-05-17 NOTE — Telephone Encounter (Signed)
-----   Message from Riki Altes, MD sent at 05/15/2020 11:45 AM EST ----- Ultrasound does show fluid-filled structure, probable hydrocele of the cord.  Can schedule aspiration in office if he desires.  15-minute slot and will need ultrasound room

## 2020-05-17 NOTE — Telephone Encounter (Signed)
Notified patient wife  as instructed. Patient will call back to schedule aspiration .

## 2020-05-17 NOTE — Telephone Encounter (Signed)
Notified patient wife as instructed, patient will

## 2020-05-18 NOTE — Telephone Encounter (Signed)
Appointment scheduled.

## 2020-05-19 ENCOUNTER — Other Ambulatory Visit: Payer: Self-pay

## 2020-05-19 ENCOUNTER — Ambulatory Visit (INDEPENDENT_AMBULATORY_CARE_PROVIDER_SITE_OTHER): Payer: Medicare HMO | Admitting: Urology

## 2020-05-19 ENCOUNTER — Encounter: Payer: Self-pay | Admitting: Urology

## 2020-05-19 VITALS — BP 148/82 | HR 68 | Ht 72.0 in | Wt 200.0 lb

## 2020-05-19 DIAGNOSIS — N433 Hydrocele, unspecified: Secondary | ICD-10-CM | POA: Diagnosis not present

## 2020-05-20 ENCOUNTER — Encounter: Payer: Medicare HMO | Admitting: Urology

## 2020-05-20 NOTE — Progress Notes (Signed)
Procedure note:  Indications: 65 y.o. male with history of spermatic cord hydrocele drained intraoperatively at time of hernia repair by general surgery.  Recurrent swelling over the last several months and ultrasound consistent with cord hydrocele.  He does not desire excision and requested aspiration  Anesthesia: Xylocaine 1% plain  Complications: None  Description: He was placed in the supine position and timeout was performed.  External genitalia were prepped and draped sterilely.  Ultrasound was performed over the swelling in the upper spermatic cord verifying non-loculated fluid  The skin overlying the area was anesthetized with 1 cc of Xylocaine  A 20-gauge Angiocath was inserted and the needle was removed however no fluid was obtained  Using manual elevation of the hydrocele a second Angiocath was inserted and ~40 cc of clear fluid was aspirated.  Repeat ultrasound was performed with only minimal fluid present.  Plan: We discussed the possibility of recurrence and 4-week follow-up scheduled.   [name], MD

## 2020-06-20 ENCOUNTER — Ambulatory Visit: Payer: Self-pay | Admitting: Urology

## 2020-06-22 ENCOUNTER — Ambulatory Visit: Payer: Medicare HMO | Admitting: Urology

## 2020-06-22 ENCOUNTER — Encounter: Payer: Self-pay | Admitting: Urology

## 2020-06-22 ENCOUNTER — Other Ambulatory Visit: Payer: Self-pay

## 2020-06-22 VITALS — BP 116/79 | HR 70 | Ht 72.0 in | Wt 200.0 lb

## 2020-06-22 DIAGNOSIS — N433 Hydrocele, unspecified: Secondary | ICD-10-CM

## 2020-06-23 NOTE — Progress Notes (Signed)
06/22/2020  7:58 AM   Cindie Crumbly Aug 09, 1955 [id number]  Referring provider: [name], MD [phone] [facility] [address] [facility] [place],  [place] [address]  Chief Complaint  Patient presents with  . Follow-up    HPI: 65 y.o. male presents for follow-up.   Right hydrocelectomy by Dr. Apolinar Junes 03/02/2019  Postop accumulation of seroma versus hematoma  Return to the OR 12/17 for reexploration and drainage of reaccumulated fluid with drain placement  Was seen by me 9/24 with recurrent swelling in the right upper scrotum felt to be either a cord hydrocele or hernia  Was evaluated by Dr. Claudine Mouton and felt to have bilateral inguinal hernias  Underwent robotic assisted laparoscopic bilateral inguinal hernia repair 02/17/2020 and found to also have a cord hydrocele which was aspirated  Seen by me 04/20/2020 and had noted fluid reaccumulation right upper cord  Ultrasound consistent with cord hydrocele; he did not desire excision and requested aspiration which was performed on 05/19/2020 with ~ 40 cc of fluid aspirated  Over the last few weeks he has noted gradual fluid reaccumulation   PMH: Past Medical History:  Diagnosis Date  . 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 HYDROCELE EXCISION Right 03/02/2019   Procedure: HYDROCELECTOMY ADULT;  Surgeon: Vanna Scotland, MD;  Location: [facility] ORS;  Service: Urology;  Laterality: Right;  . INCISION AND DRAINAGE ABSCESS Right 04/16/2019   Procedure: INCISION AND DRAINAGE scrotal seroma vs hematoma;  Surgeon: Vanna Scotland, MD;  Location: [facility] ORS;  Service: Urology;  Laterality: Right;  . NO PAST SURGERIES    . XI ROBOTIC ASSISTED INGUINAL HERNIA REPAIR WITH MESH Bilateral 02/17/2020   Procedure: XI ROBOTIC ASSISTED INGUINAL HERNIA REPAIR WITH MESH;  Surgeon: [name], MD;  Location: [facility] ORS;  Service: General;  Laterality: Bilateral;     Home Medications:  Allergies as of 06/22/2020      Reactions   Thorazine [chlorpromazine] Anaphylaxis      Medication List    as of June 22, 2020 11:59 PM   You have not been prescribed any medications.     Allergies:  Allergies  Allergen Reactions  . Thorazine [Chlorpromazine] Anaphylaxis    Family History: Family History  Problem Relation Age of Onset  . Bladder Cancer Neg Hx   . Prostate cancer Neg Hx   . Kidney cancer Neg Hx     Social History:  reports that he has quit smoking. He has never used smokeless tobacco. He reports current alcohol use. He reports that he does not use drugs.   Physical Exam: BP 116/79   Pulse 70   Ht 6' (1.829 m)   Wt 200 lb (90.7 kg)   BMI 27.12 kg/m   Constitutional:  Alert and oriented, No acute distress. HEENT: [place] AT, moist mucus membranes.  Trachea midline, no masses. Cardiovascular: No clubbing, cyanosis, or edema. Respiratory: Normal respiratory effort, no increased work of breathing. GI: Abdomen is soft, nontender, nondistended, no abdominal masses GU: ~ 4 cm fluid-filled mass right upper scrotum consistent with reaccumulated cord hydrocele Skin: No rashes, bruises or suspicious lesions. Neurologic: Grossly intact, no focal deficits, moving all 4 extremities. Psychiatric: Normal mood and affect.   Assessment & Plan:    1.  Hydrocele right spermatic cord  Operative notes were reviewed and Dr. Claudine Mouton aspirated the hydrocele  He has failed aspiration x2 and do not think additional aspiration will be effective  Would recommend excision of the cord hydrocele versus observation   Riki Altes, MD  [facility] [address] [place], [place] [address] [phone]

## 2020-06-26 ENCOUNTER — Encounter: Payer: Self-pay | Admitting: Urology

## 2020-07-11 ENCOUNTER — Encounter: Payer: Self-pay | Admitting: *Deleted

## 2020-12-14 ENCOUNTER — Other Ambulatory Visit: Payer: Self-pay

## 2020-12-14 ENCOUNTER — Ambulatory Visit (INDEPENDENT_AMBULATORY_CARE_PROVIDER_SITE_OTHER): Payer: Medicare HMO | Admitting: Urology

## 2020-12-14 ENCOUNTER — Encounter: Payer: Self-pay | Admitting: Urology

## 2020-12-14 VITALS — BP 137/77 | HR 0 | Ht 72.0 in | Wt 200.0 lb

## 2020-12-14 DIAGNOSIS — N433 Hydrocele, unspecified: Secondary | ICD-10-CM | POA: Diagnosis not present

## 2020-12-14 NOTE — Progress Notes (Signed)
12/14/2020 11:11 AM   Dan Li 09/23/1955 [id number]  Referring provider: [name], MD [phone] [facility] [address] [facility] [place],  [place] [address]  Chief Complaint  Patient presents with   Hydrocele    Urologic history: 1.  Right hydrocele Right hydrocelectomy by Dr. Apolinar Junes 03/02/2019 Postop accumulation of seroma versus hematoma Return to the OR 12/17 for reexploration and drainage of reaccumulated fluid with drain placement Was seen by me 9/24 with recurrent swelling in the right upper scrotum felt to be either a cord hydrocele or hernia Was evaluated by Dr. Claudine Mouton and felt to have bilateral inguinal hernias Underwent robotic assisted laparoscopic bilateral inguinal hernia repair 02/17/2020 and found to also have a cord hydrocele which was aspirated Seen by me 04/20/2020 and had noted fluid reaccumulation right upper cord Ultrasound consistent with cord hydrocele; he did not desire excision and requested aspiration which was performed on 05/19/2020 with ~ 40 cc of fluid aspirated   HPI: 65 y.o. male presents for 6 month follow-up.  He has noted gradual enlargement in the cord hydrocele since his last visit He does feel like the hydrocele is lower in the scrotum compared to his last visit Mild discomfort   PMH: Past Medical History:  Diagnosis Date   [name]     Surgical History: Past Surgical History:  Procedure Laterality Date   HYDROCELE EXCISION Right 03/02/2019   Procedure: HYDROCELECTOMY ADULT;  Surgeon: Vanna Scotland, MD;  Location: [facility] ORS;  Service: Urology;  Laterality: Right;   INCISION AND DRAINAGE ABSCESS Right 04/16/2019   Procedure: INCISION AND DRAINAGE scrotal seroma vs hematoma;  Surgeon: Vanna Scotland, MD;  Location: [facility] OR[facility];  Service: Urology;  Laterality: Right;   NO PAST SURGERIES     XI ROBOTIC ASSISTED INGUINAL HERNIA REPAIR WITH MESH Bilateral 02/17/2020   Procedure: XI ROBOTIC  ASSISTED INGUINAL HERNIA REPAIR WITH MESH;  Surgeon: [name], MD;  Location: [facility] ORS;  Service: General;  Laterality: Bilateral;    Home Medications:  Allergies as of 12/14/2020       Reactions   Thorazine [chlorpromazine] Anaphylaxis        Medication List    as of December 14, 2020 11:11 AM   You have not been prescribed any medications.     Allergies:  Allergies  Allergen Reactions   Thorazine [Chlorpromazine] Anaphylaxis    Family History: Family History  Problem Relation Age of Onset   Bladder Cancer Neg Hx    Prostate cancer Neg Hx    Kidney cancer Neg Hx     Social History:  reports that he has quit smoking. He has never used smokeless tobacco. He reports current alcohol use. He reports that he does not use drugs.   Physical Exam: BP 137/77   Pulse (!) 0   Ht 6' (1.829 m)   Wt 200 lb (90.7 kg)   BMI 27.12 kg/m   Constitutional:  Alert and oriented, No acute distress. HEENT: [place] AT, moist mucus membranes.  Trachea midline, no masses. Cardiovascular: No clubbing, cyanosis, or edema. Respiratory: Normal respiratory effort, no increased work of breathing. GI: Abdomen is soft, nontender, nondistended, no abdominal masses GU: Phallus without lesions, testes descended bilaterally without masses or tenderness.  ~ 7 cm tense mass superior to the right testis which has increased in size from last visit Skin: No rashes, bruises or suspicious lesions. Neurologic: Grossly intact, no focal deficits, moving all 4 extremities. Psychiatric: Normal mood and affect.  Assessment & Plan:    1. Hydrocele of spermatic cord Increased size in the last 6 months He has had 2 prior aspirations that have reaccumulated He is interested in surgical excision however would like to hold off until November 2022 when he is not as busy In the interim he has requested scheduling a repeat aspiration and will schedule under ultrasound guidance   Riki Altes,  MD  [facility] [address] [place], [place] [address] [phone]

## 2020-12-15 LAB — URINALYSIS, COMPLETE
Bilirubin, UA: NEGATIVE
Glucose, UA: NEGATIVE
Ketones, UA: NEGATIVE
Leukocytes,UA: NEGATIVE
Nitrite, UA: NEGATIVE
Protein,UA: NEGATIVE
Specific Gravity, UA: 1.015 (ref 1.005–1.030)
Urobilinogen, Ur: 0.2 mg/dL (ref 0.2–1.0)
pH, UA: 6.5 (ref 5.0–7.5)

## 2020-12-15 LAB — MICROSCOPIC EXAMINATION: Bacteria, UA: NONE SEEN

## 2021-01-06 ENCOUNTER — Encounter: Payer: Self-pay | Admitting: Urology

## 2021-01-06 ENCOUNTER — Other Ambulatory Visit: Payer: Self-pay

## 2021-01-06 ENCOUNTER — Ambulatory Visit (INDEPENDENT_AMBULATORY_CARE_PROVIDER_SITE_OTHER): Payer: Medicare HMO | Admitting: Urology

## 2021-01-06 VITALS — BP 143/74 | HR 76 | Ht 72.0 in | Wt 200.0 lb

## 2021-01-06 DIAGNOSIS — N433 Hydrocele, unspecified: Secondary | ICD-10-CM

## 2021-01-07 NOTE — Progress Notes (Signed)
Procedure Note:  Diagnosis: Right cord hydrocele  Procedure: Hydrocele aspiration  Anesthetic: 1% plain Xylocaine  Complications: None  Indications: 65 y.o. male with a recurrent hydrocele spermatic cord which is reaccumulated with aspirations x3.  He desires surgical excision however due to schedule is not able to have this performed until November 2022 and presents requesting aspiration  Description: Ultrasound was performed of the right hemiscrotal mass which was anechoic and testis was not visible.  The anterior upper scrotal skin was anesthetized with 1 cc of 1% plain Xylocaine.  A 16-gauge Angiocath was placed to the anesthetized area into the hydrocele.  The needle was removed and ~ 200 cc of straw-colored fluid was aspirated with resolution of the hydrocele noted.  Pressure was held x3 minutes  Plan: He was instructed to call for redness, swelling or fever.  If the fluid reaccumulate's he will schedule hydrocelectomy around November 2022   [name], MD

## 2021-02-21 ENCOUNTER — Telehealth: Payer: Self-pay | Admitting: Urology

## 2021-02-21 DIAGNOSIS — R3129 Other microscopic hematuria: Secondary | ICD-10-CM

## 2021-02-21 NOTE — Telephone Encounter (Signed)
This pt. Called and spoke with Doreen Salvage and stated he would like to schedule surgery. Please advise.

## 2021-02-27 ENCOUNTER — Other Ambulatory Visit: Payer: Self-pay | Admitting: Urology

## 2021-02-27 DIAGNOSIS — R3129 Other microscopic hematuria: Secondary | ICD-10-CM

## 2021-02-27 DIAGNOSIS — N433 Hydrocele, unspecified: Secondary | ICD-10-CM

## 2021-02-27 NOTE — Progress Notes (Signed)
Surgical Physician Order Form  * Scheduling expectation : Next Available  *Length of Case: 60 minutes  *Clearance needed: no  *Anticoagulation Instructions: N/A  *Aspirin Instructions: N/A  *Post-op visit Date/Instructions:  1-3 day drain removal with PA and 1 month follow-up with me  *Diagnosis:  1.  Hydrocele right spermatic cord 2.  Microhematuria  *Procedure:  1.  Flexible cystoscopy 2.  Excision spermatic cord hydrocele  -Admit type: OUTpatient  -Anesthesia: Choice  -VTE Prophylaxis Standing Order SCD's       Other:   -Standing Lab Orders Per Anesthesia    Lab other: UA&Urine Culture  -Standing Test orders EKG/Chest x-ray per Anesthesia       Test other:   - Medications:     Ancef 2gm IV   Other Instructions:

## 2021-02-27 NOTE — Telephone Encounter (Signed)
Scheduling sheet sent.  Would you also let him know that his urinalysis and August showed microhematuria and would recommend flexible cystoscopy at the time of hydrocelectomy.  I have also put in an order for a CT urogram.

## 2021-03-06 ENCOUNTER — Telehealth: Payer: Self-pay | Admitting: Urology

## 2021-03-06 NOTE — Progress Notes (Signed)
[facility] Posting Form   Surgery Date/Time: Date: 03/28/2021  Surgeon: Dr. [name], MD  Surgery Location: Day Surgery  Inpt ( No  )   Outpt (Yes)   Obs ( No  )   Diagnosis: N43.3 Spermatic Cord Hydrocele; R31.29 Microhematuria  -CPT: 52000  Surgery: Flexible Cystoscopy  -CPT: 55217  Surgery: Excision of Right Spermatic Cord Hydrocele  Stop Anticoagulations: No  Cardiac/Medical/Pulmonary Clearance needed: No  *Orders entered into EPIC  Date: 03/06/21   *Case booked in EPIC  Date: 03/03/2021  *Notified pt of Surgery: Date: 03/06/21  PRE-OP UA & CX: Yes, will obtain on 03/20/2021  *Placed into Prior Authorization Work Angela Nevin Date: 03/06/21   Assistant/laser/rep:No

## 2021-03-06 NOTE — Telephone Encounter (Signed)
Per Dr. Lonna Cobb Patient is to be scheduled for an excision of Spermatic Cord Hydrocele and a Cystoscopy   Dan Li  was contacted and possible surgical dates were discussed, 03/28/21 was agreed upon for surgery. Patient was instructed that Dr. Lonna Cobb will require them to provide a pre-op UA & CX prior to surgery. This was ordered and scheduled drop off appointment was made for 03/20/21 @ 8:00 am..   Patient was directed to call [phone] between 1-3pm the day before surgery to find out surgical arrival time.  Instructions were given not to eat or drink from midnight on the night before surgery and have a driver for the day of surgery. On the surgery day patient was instructed to enter through the Medical Mall entrance of [facility] report the Same Day Surgery desk.   Pre-Admit Testing will be in contact via phone to set up an interview with the anesthesia team to review your history and medications prior to surgery.   Reminder of this information was sent via mychart to the patient.

## 2021-03-17 ENCOUNTER — Encounter
Admission: RE | Admit: 2021-03-17 | Discharge: 2021-03-17 | Disposition: A | Discharge status: Home / Self Care | Payer: Medicare HMO | Source: Ambulatory Visit | Attending: Urology | Admitting: Urology

## 2021-03-17 ENCOUNTER — Other Ambulatory Visit: Payer: Self-pay

## 2021-03-17 HISTORY — DX: Unilateral inguinal hernia, without obstruction or gangrene, not specified as recurrent: K40.90

## 2021-03-17 HISTORY — DX: Hydrocele, unspecified: N43.3

## 2021-03-17 NOTE — Patient Instructions (Addendum)
Your procedure is scheduled on: Tuesday 03/28/21 Report to the Registration Desk on the 1st floor of the Medical Mall. To find out your arrival time, please call [phone] between 1PM - 3PM on: Monday 03/27/21  REMEMBER: Instructions that are not followed completely may result in serious medical risk, up to and including death; or upon the discretion of your surgeon and anesthesiologist your surgery may need to be rescheduled.  Do not eat food or drink after midnight the night before surgery.  No gum chewing, lozengers or hard candies.   TAKE THESE MEDICATIONS THE MORNING OF SURGERY WITH A SIP OF WATER: None   One week prior to surgery: Stop Anti-inflammatories (NSAIDS) such as Advil, Aleve, Ibuprofen, Motrin, Naproxen, Naprosyn and Aspirin based products such as Excedrin, Goodys Powder, BC Powder. Stop triamcinolone (NASACORT) 55 MCG/ACT AERO nasal inhaler ANY OVER THE COUNTER supplements until after surgery. You may however, continue to take Tylenol if needed for pain up until the day of surgery.  No Alcohol for 24 hours before or after surgery.  No Smoking including e-cigarettes for 24 hours prior to surgery.  No chewable tobacco products for at least 6 hours prior to surgery.  No nicotine patches on the day of surgery.  Do not use any "recreational" drugs for at least a week prior to your surgery.  Please be advised that the combination of cocaine and anesthesia may have negative outcomes, up to and including death. If you test positive for cocaine, your surgery will be cancelled.  On the morning of surgery brush your teeth with toothpaste and water, you may rinse your mouth with mouthwash if you wish. Do not swallow any toothpaste or mouthwash.  Do not wear jewelry.  Do not wear lotions, powders, or colognes.   Do not shave body from the neck down 48 hours prior to surgery just in case you cut yourself which could leave a site for infection.   Do not bring valuables  to the hospital. [facility] is not responsible for any missing/lost belongings or valuables.   Notify your doctor if there is any change in your medical condition (cold, fever, infection).  Wear comfortable clothing (specific to your surgery type) to the hospital.  After surgery, you can help prevent lung complications by doing breathing exercises.  Take deep breaths and cough every 1-2 hours.  If you are being discharged the day of surgery, you will not be allowed to drive home. You will need a responsible adult (18 years or older) to drive you home and stay with you that night.   If you are taking public transportation, you will need to have a responsible adult (18 years or older) with you. Please confirm with your physician that it is acceptable to use public transportation.   Please call the Pre-admissions Testing Dept. at [phone] if you have any questions about these instructions.  Surgery Visitation Policy:  Patients undergoing a surgery or procedure may have one family member or support person with them as long as that person is not COVID-19 positive or experiencing its symptoms.  That person may remain in the waiting area during the procedure and may rotate out with other people.  Inpatient Visitation:    Visiting hours are 7 a.m. to 8 p.m. Up to two visitors ages 16+ are allowed at one time in a patient room. The visitors may rotate out with other people during the day. Visitors must check out when they leave, or other visitors will not  be allowed. One designated support person may remain overnight. The visitor must pass COVID-19 screenings, use hand sanitizer when entering and exiting the patient's room and wear a mask at all times, including in the patient's room. Patients must also wear a mask when staff or their visitor are in the room. Masking is required regardless of vaccination status.

## 2021-03-20 ENCOUNTER — Other Ambulatory Visit: Payer: Medicare HMO

## 2021-03-20 ENCOUNTER — Other Ambulatory Visit: Payer: Self-pay

## 2021-03-20 DIAGNOSIS — R3129 Other microscopic hematuria: Secondary | ICD-10-CM

## 2021-03-20 DIAGNOSIS — N433 Hydrocele, unspecified: Secondary | ICD-10-CM

## 2021-03-20 LAB — URINALYSIS, COMPLETE
Bilirubin, UA: NEGATIVE
Glucose, UA: NEGATIVE
Ketones, UA: NEGATIVE
Leukocytes,UA: NEGATIVE
Nitrite, UA: NEGATIVE
Protein,UA: NEGATIVE
RBC, UA: NEGATIVE
Specific Gravity, UA: 1.02 (ref 1.005–1.030)
Urobilinogen, Ur: 0.2 mg/dL (ref 0.2–1.0)
pH, UA: 7 (ref 5.0–7.5)

## 2021-03-20 LAB — MICROSCOPIC EXAMINATION
Bacteria, UA: NONE SEEN
Epithelial Cells (non renal): NONE SEEN /hpf (ref 0–10)

## 2021-03-21 ENCOUNTER — Encounter: Payer: Self-pay | Admitting: Urgent Care

## 2021-03-21 ENCOUNTER — Ambulatory Visit
Admission: RE | Admit: 2021-03-21 | Discharge: 2021-03-21 | Disposition: A | Discharge status: Home / Self Care | Payer: Medicare HMO | Source: Ambulatory Visit | Attending: Urology | Admitting: Urology

## 2021-03-21 ENCOUNTER — Other Ambulatory Visit
Admission: RE | Admit: 2021-03-21 | Discharge: 2021-03-21 | Disposition: A | Discharge status: Home / Self Care | Payer: Medicare HMO | Source: Ambulatory Visit | Attending: Urology | Admitting: Urology

## 2021-03-21 DIAGNOSIS — R3129 Other microscopic hematuria: Secondary | ICD-10-CM | POA: Insufficient documentation

## 2021-03-21 DIAGNOSIS — Z01810 Encounter for preprocedural cardiovascular examination: Secondary | ICD-10-CM | POA: Insufficient documentation

## 2021-03-21 LAB — POCT I-STAT CREATININE: Creatinine, Ser: 1.5 mg/dL — ABNORMAL HIGH (ref 0.61–1.24)

## 2021-03-21 MED ORDER — IOHEXOL 350 MG/ML SOLN
100.0000 mL | Freq: Once | INTRAVENOUS | Status: AC | PRN
  Administered 2021-03-21: 100 mL via INTRAVENOUS

## 2021-03-22 LAB — CULTURE, URINE COMPREHENSIVE

## 2021-03-28 ENCOUNTER — Ambulatory Visit
Admission: RE | Admit: 2021-03-28 | Discharge: 2021-03-28 | Disposition: A | Discharge status: Home / Self Care | Payer: Medicare HMO | Attending: Urology | Admitting: Urology

## 2021-03-28 ENCOUNTER — Encounter: Payer: Self-pay | Admitting: Urology

## 2021-03-28 ENCOUNTER — Ambulatory Visit: Payer: Medicare HMO | Admitting: Anesthesiology

## 2021-03-28 ENCOUNTER — Other Ambulatory Visit: Payer: Self-pay

## 2021-03-28 ENCOUNTER — Encounter
Admission: RE | Disposition: A | Discharge status: Home / Self Care | Payer: Self-pay | Source: Home / Self Care | Attending: Urology

## 2021-03-28 DIAGNOSIS — Z87891 Personal history of nicotine dependence: Secondary | ICD-10-CM | POA: Insufficient documentation

## 2021-03-28 DIAGNOSIS — N433 Hydrocele, unspecified: Secondary | ICD-10-CM

## 2021-03-28 DIAGNOSIS — R3129 Other microscopic hematuria: Secondary | ICD-10-CM | POA: Diagnosis not present

## 2021-03-28 HISTORY — PX: CYSTOSCOPY: SHX5120

## 2021-03-28 HISTORY — PX: HYDROCELE EXCISION: SHX482

## 2021-03-28 SURGERY — HYDROCELECTOMY
Anesthesia: General | Site: Perineum | Laterality: Right

## 2021-03-28 MED ORDER — PROPOFOL 10 MG/ML IV BOLUS
INTRAVENOUS | Status: DC | PRN
  Administered 2021-03-28: 150 mg via INTRAVENOUS

## 2021-03-28 MED ORDER — PHENYLEPHRINE HCL (PRESSORS) 10 MG/ML IV SOLN
INTRAVENOUS | Status: DC | PRN
  Administered 2021-03-28: 80 ug via INTRAVENOUS

## 2021-03-28 MED ORDER — MIDAZOLAM HCL 2 MG/2ML IJ SOLN
INTRAMUSCULAR | Status: DC | PRN
  Administered 2021-03-28: 2 mg via INTRAVENOUS

## 2021-03-28 MED ORDER — HYDROCODONE-ACETAMINOPHEN 5-325 MG PO TABS: 1.0000 | ORAL_TABLET | ORAL | 0 refills | Status: DC | PRN

## 2021-03-28 MED ORDER — DEXAMETHASONE SODIUM PHOSPHATE 10 MG/ML IJ SOLN
INTRAMUSCULAR | Status: AC
  Filled 2021-03-28: qty 1

## 2021-03-28 MED ORDER — ONDANSETRON HCL 4 MG/2ML IJ SOLN
INTRAMUSCULAR | Status: AC
  Filled 2021-03-28: qty 2

## 2021-03-28 MED ORDER — HYDROMORPHONE HCL 1 MG/ML IJ SOLN: 0.2500 mg | INTRAMUSCULAR | Status: DC | PRN

## 2021-03-28 MED ORDER — SULFAMETHOXAZOLE-TRIMETHOPRIM 800-160 MG PO TABS: 1.0000 | ORAL_TABLET | Freq: Two times a day (BID) | ORAL | 0 refills | Status: AC

## 2021-03-28 MED ORDER — EPHEDRINE SULFATE 50 MG/ML IJ SOLN
INTRAMUSCULAR | Status: DC | PRN
  Administered 2021-03-28 (×2): 10 mg via INTRAVENOUS
  Administered 2021-03-28: 5 mg via INTRAVENOUS

## 2021-03-28 MED ORDER — ACETAMINOPHEN 325 MG PO TABS: 325.0000 mg | ORAL_TABLET | ORAL | Status: DC | PRN

## 2021-03-28 MED ORDER — ACETAMINOPHEN 10 MG/ML IV SOLN
1000.0000 mg | Freq: Once | INTRAVENOUS | Status: DC | PRN
  Administered 2021-03-28: 1000 mg via INTRAVENOUS

## 2021-03-28 MED ORDER — CEFAZOLIN SODIUM-DEXTROSE 2-4 GM/100ML-% IV SOLN
2.0000 g | INTRAVENOUS | Status: AC
  Administered 2021-03-28: 2 g via INTRAVENOUS

## 2021-03-28 MED ORDER — OXYCODONE HCL 5 MG/5ML PO SOLN: 5.0000 mg | Freq: Once | ORAL | Status: AC | PRN

## 2021-03-28 MED ORDER — CHLORHEXIDINE GLUCONATE 0.12 % MT SOLN
15.0000 mL | Freq: Once | OROMUCOSAL | Status: AC
  Administered 2021-03-28: 15 mL via OROMUCOSAL

## 2021-03-28 MED ORDER — LIDOCAINE HCL (PF) 2 % IJ SOLN
INTRAMUSCULAR | Status: AC
  Filled 2021-03-28: qty 5

## 2021-03-28 MED ORDER — OXYCODONE HCL 5 MG PO TABS
5.0000 mg | ORAL_TABLET | Freq: Once | ORAL | Status: AC | PRN
  Administered 2021-03-28: 5 mg via ORAL

## 2021-03-28 MED ORDER — DEXAMETHASONE SODIUM PHOSPHATE 10 MG/ML IJ SOLN
INTRAMUSCULAR | Status: DC | PRN
Start: 2021-03-28 — End: 2021-03-28
  Administered 2021-03-28: 10 mg via INTRAVENOUS

## 2021-03-28 MED ORDER — BACITRACIN 500 UNIT/GM EX OINT
TOPICAL_OINTMENT | CUTANEOUS | Status: DC | PRN
  Administered 2021-03-28: 1 via TOPICAL

## 2021-03-28 MED ORDER — SODIUM CHLORIDE 0.9 % IR SOLN
Status: DC | PRN
  Administered 2021-03-28: 500 mL

## 2021-03-28 MED ORDER — ORAL CARE MOUTH RINSE: 15.0000 mL | Freq: Once | OROMUCOSAL | Status: AC

## 2021-03-28 MED ORDER — ACETAMINOPHEN 160 MG/5ML PO SOLN
325.0000 mg | ORAL | Status: DC | PRN
  Filled 2021-03-28: qty 20.3

## 2021-03-28 MED ORDER — LACTATED RINGERS IV SOLN: INTRAVENOUS | Status: DC

## 2021-03-28 MED ORDER — FENTANYL CITRATE (PF) 100 MCG/2ML IJ SOLN
INTRAMUSCULAR | Status: DC | PRN
  Administered 2021-03-28 (×4): 25 ug via INTRAVENOUS

## 2021-03-28 MED ORDER — BUPIVACAINE HCL 0.5 % IJ SOLN
INTRAMUSCULAR | Status: DC | PRN
  Administered 2021-03-28: 6 mL

## 2021-03-28 MED ORDER — FAMOTIDINE 20 MG PO TABS
20.0000 mg | ORAL_TABLET | Freq: Once | ORAL | Status: AC
  Administered 2021-03-28: 20 mg via ORAL

## 2021-03-28 MED ORDER — ONDANSETRON HCL 4 MG/2ML IJ SOLN
INTRAMUSCULAR | Status: DC | PRN
  Administered 2021-03-28: 4 mg via INTRAVENOUS

## 2021-03-28 MED ORDER — EPHEDRINE 5 MG/ML INJ
INTRAVENOUS | Status: AC
  Filled 2021-03-28: qty 5

## 2021-03-28 MED ORDER — FENTANYL CITRATE (PF) 100 MCG/2ML IJ SOLN
INTRAMUSCULAR | Status: AC
  Filled 2021-03-28: qty 2

## 2021-03-28 MED ORDER — MIDAZOLAM HCL 2 MG/2ML IJ SOLN
INTRAMUSCULAR | Status: AC
  Filled 2021-03-28: qty 2

## 2021-03-28 MED ORDER — LIDOCAINE HCL (CARDIAC) PF 100 MG/5ML IV SOSY
PREFILLED_SYRINGE | INTRAVENOUS | Status: DC | PRN
  Administered 2021-03-28: 80 mg via INTRAVENOUS

## 2021-03-28 SURGICAL SUPPLY — 51 items
APL PRP STRL LF DISP 70% ISPRP (MISCELLANEOUS) ×2
BAG DRAIN CYSTO-URO LG1000N (MISCELLANEOUS) ×3 IMPLANT
BLADE CLIPPER SURG (BLADE) ×3 IMPLANT
BLADE SURG 15 STRL LF DISP TIS (BLADE) ×2 IMPLANT
BLADE SURG 15 STRL SS (BLADE) ×3
CHLORAPREP W/TINT 26 (MISCELLANEOUS) ×3 IMPLANT
DRAIN PENROSE 1/4X12 LTX STRL (WOUND CARE) ×3 IMPLANT
DRAIN PENROSE 12X.25 LTX STRL (MISCELLANEOUS) IMPLANT
DRAPE LAPAROTOMY 77X122 PED (DRAPES) ×3 IMPLANT
DRSG GAUZE FLUFF 36X18 (GAUZE/BANDAGES/DRESSINGS) ×3 IMPLANT
DRSG TELFA 4X3 1S NADH ST (GAUZE/BANDAGES/DRESSINGS) ×3 IMPLANT
ELECT CAUTERY BLADE 6.4 (BLADE) IMPLANT
ELECT REM PT RETURN 9FT ADLT (ELECTROSURGICAL) ×3
ELECTRODE REM PT RTRN 9FT ADLT (ELECTROSURGICAL) ×2 IMPLANT
GAUZE 4X4 16PLY ~~LOC~~+RFID DBL (SPONGE) ×6 IMPLANT
GAUZE SPONGE 4X4 12PLY STRL (GAUZE/BANDAGES/DRESSINGS) IMPLANT
GLOVE SURG UNDER POLY LF SZ7.5 (GLOVE) ×6 IMPLANT
GOWN STRL REUS W/ TWL LRG LVL3 (GOWN DISPOSABLE) ×4 IMPLANT
GOWN STRL REUS W/ TWL XL LVL3 (GOWN DISPOSABLE) ×2 IMPLANT
GOWN STRL REUS W/TWL LRG LVL3 (GOWN DISPOSABLE) ×6
GOWN STRL REUS W/TWL XL LVL3 (GOWN DISPOSABLE) ×3
GOWN STRL REUS W/TWL XL LVL4 (GOWN DISPOSABLE) ×3 IMPLANT
KIT TURNOVER CYSTO (KITS) ×3 IMPLANT
KIT TURNOVER KIT A (KITS) ×3 IMPLANT
LABEL OR SOLS (LABEL) ×3 IMPLANT
MANIFOLD NEPTUNE II (INSTRUMENTS) ×3 IMPLANT
NEEDLE HYPO 25X1 1.5 SAFETY (NEEDLE) ×3 IMPLANT
NS IRRIG 500ML POUR BTL (IV SOLUTION) ×3 IMPLANT
PACK BASIN MINOR ARMC (MISCELLANEOUS) ×3 IMPLANT
PACK CYSTO AR (MISCELLANEOUS) ×3 IMPLANT
SET CYSTO W/LG BORE CLAMP LF (SET/KITS/TRAYS/PACK) ×3 IMPLANT
SUPPORETR ATHLETIC LG (MISCELLANEOUS) ×2 IMPLANT
SUPPORTER ATHLETIC LG (MISCELLANEOUS) ×3
SURGILUBE 2OZ TUBE FLIPTOP (MISCELLANEOUS) ×3 IMPLANT
SUT CHROMIC 3 0 PS 2 (SUTURE) ×3 IMPLANT
SUT CHROMIC 3 0 SH 27 (SUTURE) ×3 IMPLANT
SUT ETHILON 3-0 FS-10 30 BLK (SUTURE) ×3
SUT ETHILON NAB PS2 4-0 18IN (SUTURE) IMPLANT
SUT MNCRL 3-0 UNDYED SH (SUTURE) ×2 IMPLANT
SUT MONOCRYL 3-0 UNDYED (SUTURE) ×1
SUT PDS AB 5-0 P3 18 (SUTURE) ×3 IMPLANT
SUT VIC AB 3-0 SH 27 (SUTURE) ×3
SUT VIC AB 3-0 SH 27X BRD (SUTURE) ×2 IMPLANT
SUTURE EHLN 3-0 FS-10 30 BLK (SUTURE) ×2 IMPLANT
SYR 10ML LL (SYRINGE) ×3 IMPLANT
SYR BULB IRRIG 60ML STRL (SYRINGE) ×3 IMPLANT
SYR TOOMEY IRRIG 70ML (MISCELLANEOUS) ×3
SYRINGE TOOMEY IRRIG 70ML (MISCELLANEOUS) ×2 IMPLANT
WATER STERILE IRR 1000ML POUR (IV SOLUTION) ×3 IMPLANT
WATER STERILE IRR 3000ML UROMA (IV SOLUTION) IMPLANT
WATER STERILE IRR 500ML POUR (IV SOLUTION) ×3 IMPLANT

## 2021-03-28 NOTE — Interval H&P Note (Signed)
History and Physical Interval Note:  03/28/2021 7:21 AM  Dan Li  has presented today for surgery, with the diagnosis of Hydrocele,Microhematuria.  The various methods of treatment have been discussed with the patient and family. After consideration of risks, benefits and other options for treatment, the patient has consented to  Procedure(s): EXCISION SPERMATIC CORD HYDROCELE (Right) CYSTOSCOPY (N/A) as a surgical intervention.  The patient's history has been reviewed, patient examined, no change in status, stable for surgery.  I have reviewed the patient's chart and labs.  Questions were answered to the patient's satisfaction.     Ramyah Pankowski C Velda Wendt

## 2021-03-28 NOTE — Op Note (Signed)
Preoperative diagnosis:  Right spermatic cord hydrocele Microhematuria  Postoperative diagnosis:  Right spermatic cord hydrocele Microhematuria  Procedure: Excision right spermatic cord hydrocele Cystoscopy  Surgeon: Riki Altes, MD  Anesthesia: General  Complications: None  Intraoperative findings:   1.  Cystoscopy: Urethra normal in caliber without stricture, prostate with moderate lateral lobe enlargement and hypervascularity.  Bladder mucosa without erythema, solid or papillary lesions.  Mild-moderate trabeculation.  UOs normal-appearing.  No intravesical median lobe or lesions on retroflex  2.  Large spermatic cord hydrocele in the upper cord distal to the external inguinal ring  EBL: Minimal  Specimens: None  Indication: Dan Li is a 65 y.o.  with an upper spermatic cord hydrocele.  He has undergone aspiration x3 with reaccumulation.  He has requested surgical excision.  After reviewing the management options for treatment, he elected to proceed with the above surgical procedure(s). We have discussed the potential benefits and risks of the procedure, side effects of the proposed treatment, the likelihood of the patient achieving the goals of the procedure, and any potential problems that might occur during the procedure or recuperation. Informed consent has been obtained.  Description of procedure:  The patient was taken to the operating room and general anesthesia was induced.  The patient was placed in the dorsal lithotomy position, prepped and draped in the usual sterile fashion, and preoperative antibiotics were administered. A preoperative time-out was performed.   A transverse incision was made overlying the palpable hydrocele and carried through the subcutaneous tissue with cautery to expose the hydrocele sac.  Using blunt dissection the cord hydrocele was delivered through the incision.  There was scarring of the lower spermatic cord testis secondary to prior  hydrocelectomy.  The spermatic cord was splayed out over the hydrocele.The spermatic cord was encircled with a Penrose drain just proximal to the hydrocele.  Due to significant splaying of the cord with a hydrocele dissection of the cord over the hydrocele was not attempted.  The hydrocele sac was punctured and approximately 150 cc of clear fluid was aspirated.  Sections of the hydrocele sac were excised with cautery and the ear is not involved with the spermatic cord.  The hydrocele sac was explored and was blind-ending with no evidence of a communicating hydrocele.  The majority of the sac was then dissected free inferior to the spermatic cord.  Hemostasis was obtained with cautery.  The cord was delivered back into the operative field and copiously irrigated with saline.  Hemostasis was noted to be adequate.  A quadrant Penrose drain was placed into the wound and through the lateral aspect of the mid hemiscrotum and secured with 3-0 nylon.  The skin edges were anesthetized with 1% plain Marcaine.  Subcutaneous tissue was closed with running 3-0 Vicryl suture.  The skin was closed with a running subcuticular suture of 5-0 PDS.  Dermabond was applied to the incision.  Fluffs and scrotal support placed along with a Telfa dressing over the incision.  After anesthetic reversal he was transported the PACU in stable condition  Plan: Appointment scheduled 03/30/2021 for drain removal Postop follow-up scheduled 04/26/2021   Riki Altes, M.D.

## 2021-03-28 NOTE — H&P (Signed)
Urology Consult  History of Present Illness: Dan Li is a 65 y.o. with the following urologic history.  Right hydrocelectomy by Dr. Apolinar Junes 03/02/2019 Postop accumulation of seroma versus hematoma Return to the OR 12/17 for reexploration and drainage of reaccumulated fluid with drain placement Was seen by me 9/24 with recurrent swelling in the right upper scrotum felt to be either a cord hydrocele or hernia Was evaluated by Dr. Claudine Mouton and felt to have bilateral inguinal hernias Underwent robotic assisted laparoscopic bilateral inguinal hernia repair 02/17/2020 and found to also have a cord hydrocele which was aspirated Seen by me 04/20/2020 and had noted fluid reaccumulation right upper cord Ultrasound consistent with cord hydrocele; he did not desire excision and requested aspiration which was performed on 05/19/2020 with ~ 40 cc of fluid aspirated  He underwent repeat aspiration 01/06/2021 with reaccumulation and has requested excision of the spermatic cord hydrocele    Past Medical History:  Diagnosis Date   [name]    Hydrocele of testis    Inguinal hernia     Past Surgical History:  Procedure Laterality Date   HYDROCELE EXCISION Right 03/02/2019   Procedure: HYDROCELECTOMY ADULT;  Surgeon: Vanna Scotland, MD;  Location: [facility] ORS;  Service: Urology;  Laterality: Right;   INCISION AND DRAINAGE ABSCESS Right 04/16/2019   Procedure: INCISION AND DRAINAGE scrotal seroma vs hematoma;  Surgeon: Vanna Scotland, MD;  Location: [facility] OR[facility];  Service: Urology;  Laterality: Right;   NO PAST SURGERIES     XI ROBOTIC ASSISTED INGUINAL HERNIA REPAIR WITH MESH Bilateral 02/17/2020   Procedure: XI ROBOTIC ASSISTED INGUINAL HERNIA REPAIR WITH MESH;  Surgeon: [name], MD;  Location: [facility] ORS;  Service: General;  Laterality: Bilateral;    Home Medications:  Current Meds  Medication Sig   olopatadine (PATANOL) 0.1 % ophthalmic solution Place 1 drop into both eyes  daily.   triamcinolone (NASACORT) 55 MCG/ACT AERO nasal inhaler Place 2 sprays into the nose daily.    Allergies:  Allergies  Allergen Reactions   Thorazine [Chlorpromazine] Anaphylaxis    Family History  Problem Relation Age of Onset   Bladder Cancer Neg Hx    Prostate cancer Neg Hx    Kidney cancer Neg Hx     Social History:  reports that he has quit smoking. He has never used smokeless tobacco. He reports current alcohol use. He reports that he does not use drugs.  ROS: Otherwise noncontributory except as per the HPI  Physical Exam:  Vital signs in last 24 hours: Temp:  [96.9 F (36.1 C)] 96.9 F (36.1 C) (11/29 0623) Pulse Rate:  [73] 73 (11/29 0623) Resp:  [16] 16 (11/29 0623) BP: (131)/(81) 131/81 (11/29 0623) SpO2:  [99 %] 99 % (11/29 9518) Constitutional:  Alert and oriented, No acute distress HEENT: [place] AT, moist mucus membranes.  Trachea midline, no masses Cardiovascular: Regular rate and rhythm, no clubbing, cyanosis, or edema. Respiratory: Normal respiratory effort, lungs clear bilaterally GI: Abdomen is soft, nontender, nondistended, no abdominal masses GU: Cystic mass right upper spermatic cord.  Testes descended bilaterally without masses or tenderness Skin: No rashes, bruises or suspicious lesions Lymph: No cervical or inguinal adenopathy Neurologic: Grossly intact, no focal deficits, moving all 4 extremities Psychiatric: Normal mood and affect   Laboratory Data:  No results for input(s): WBC, HGB, HCT in the last 72 hours. No results for input(s): NA, K, CL, CO2, GLUCOSE, BUN, CREATININE, CALCIUM in the last 72 hours. No results for input(s):  LABPT, INR in the last 72 hours. No results for input(s): LABURIN in the last 72 hours. Results for orders placed or performed in visit on 03/20/21  CULTURE, URINE COMPREHENSIVE     Status: None   Collection Time: 03/20/21  9:07 AM   Specimen: Urine   UR  Result Value Ref Range Status   Urine Culture,  Comprehensive Final report  Final   Organism ID, Bacteria Comment  Final    Comment: No growth in 36 - 48 hours.  Microscopic Examination     Status: None   Collection Time: 03/20/21  9:07 AM   Urine  Result Value Ref Range Status   WBC, UA 0-5 0 - 5 /hpf Final   RBC 0-2 0 - 2 /hpf Final   Epithelial Cells (non renal) None seen 0 - 10 /hpf Final   Bacteria, UA None seen None seen/Few Final     Impression/Assessment:  Hydrocele right spermatic cord  Plan:  Excision hydrocele right spermatic cord The procedure has been discussed in detail including potential risks of bleeding/hematoma, infection/abscess and recurrence.  All questions were answered and he desires to proceed   03/28/2021, 7:16 AM  Dan Axon,  MD

## 2021-03-28 NOTE — Anesthesia Postprocedure Evaluation (Signed)
Anesthesia Post Note  Patient: Dan Li  Procedure(s) Performed: EXCISION SPERMATIC CORD HYDROCELE (Right: Perineum) CYSTOSCOPY  Patient location during evaluation: PACU Anesthesia Type: General Level of consciousness: awake and alert Pain management: pain level controlled Vital Signs Assessment: post-procedure vital signs reviewed and stable Respiratory status: spontaneous breathing, nonlabored ventilation, respiratory function stable and patient connected to nasal cannula oxygen Cardiovascular status: blood pressure returned to baseline and stable Postop Assessment: no apparent nausea or vomiting Anesthetic complications: no   No notable events documented.   Last Vitals:  Vitals:   03/28/21 0945 03/28/21 0957  BP:  125/76  Pulse: (!) 58 62  Resp: 14 16  Temp: (!) 36.1 C (!) 36.1 C  SpO2: 97% 100%    Last Pain:  Vitals:   03/28/21 0957  TempSrc: Temporal  PainSc: 4                  Aysa Larivee M Abbagale Goguen

## 2021-03-28 NOTE — Discharge Instructions (Addendum)
Discharge instructions following scrotal surgery  Call your doctor for: Fever is greater than 100.5 Severe nausea or vomiting Increasing pain not controlled by pain medication Increasing redness or drainage from incisions  The number for questions or concerns is [phone]  Activity level: No lifting greater than 10 pounds (about equal to gallon of milk) for the next 2 weeks or until cleared to do so at follow-up appointment.  Otherwise activity as tolerated by comfort level.  Diet: May resume your regular diet as tolerated  Driving: No driving while still taking opiate pain medications (weight at least 6-8 hours after last dose).  No driving if you still sore from surgery as it may limit her ability to react quickly if necessary.   Shower/bath: You may shower after your drain is removed 03/30/2021.  No tub bath, hot tub or swimming for 10 days.  Wound care: He may cover wounds with sterile gauze as needed to prevent incisions rubbing on close follow-up in any seepage.  Where tight fitting underpants for at least 2 weeks.  He should apply cold compresses (ice or sac of frozen peas/corn) to your scrotum for at least 48 hours to reduce the swelling.  You should expect that his scrotum will swell up initially and then get smaller over the next 2-4 weeks.  Medications: Prescriptions for pain medication and an antibiotic were sent to your pharmacy  Follow-up appointments: 03/30/2021 for drain removal 04/26/2021 for postop follow-up   AMBULATORY SURGERY  DISCHARGE INSTRUCTIONS   The drugs that you were given will stay in your system until tomorrow so for the next 24 hours you should not:  Drive an automobile Make any legal decisions Drink any alcoholic beverage   You may resume regular meals tomorrow.  Today it is better to start with liquids and gradually work up to solid foods.  You may eat anything you prefer, but it is better to start with liquids, then soup and crackers,  and gradually work up to solid foods.   Please notify your doctor immediately if you have any unusual bleeding, trouble breathing, redness and pain at the surgery site, drainage, fever, or pain not relieved by medication.    Additional Instructions:        Please contact your physician with any problems or Same Day Surgery at [phone], Monday through Friday 6 am to 4 pm, or [place] at [facility] number at [phone].

## 2021-03-28 NOTE — Anesthesia Procedure Notes (Signed)
Procedure Name: LMA Insertion Date/Time: 03/28/2021 7:43 AM Performed by: Irving Burton, CRNA Pre-anesthesia Checklist: Patient identified, Patient being monitored, Timeout performed, Emergency Drugs available and Suction available Patient Re-evaluated:Patient Re-evaluated prior to induction Oxygen Delivery Method: Circle system utilized Preoxygenation: Pre-oxygenation with 100% oxygen Induction Type: IV induction Ventilation: Mask ventilation without difficulty LMA: LMA inserted LMA Size: 4.0 Tube type: Oral Number of attempts: 1 Placement Confirmation: positive ETCO2 and breath sounds checked- equal and bilateral Tube secured with: Tape Dental Injury: Teeth and Oropharynx as per pre-operative assessment

## 2021-03-28 NOTE — Transfer of Care (Signed)
Immediate Anesthesia Transfer of Care Note  Patient: Dan Li  Procedure(s) Performed: EXCISION SPERMATIC CORD HYDROCELE (Right: Perineum) CYSTOSCOPY  Patient Location: PACU  Anesthesia Type:General  Level of Consciousness: sedated  Airway & Oxygen Therapy: Patient Spontanous Breathing and Patient connected to face mask oxygen  Post-op Assessment: Report given to RN and Post -op Vital signs reviewed and stable  Post vital signs: Reviewed and stable  Last Vitals:  Vitals Value Taken Time  BP 103/74 03/28/21 0910  Temp    Pulse 59 03/28/21 0913  Resp 11 03/28/21 0913  SpO2 99 % 03/28/21 0913  Vitals shown include unvalidated device data.  Last Pain:  Vitals:   03/28/21 0623  TempSrc: Temporal  PainSc: 0-No pain         Complications: No notable events documented.

## 2021-03-28 NOTE — Anesthesia Preprocedure Evaluation (Addendum)
Anesthesia Evaluation  Patient identified by MRN, date of birth, ID band Patient awake    Reviewed: Allergy & Precautions, NPO status , Patient's Chart, lab work & pertinent test results  History of Anesthesia Complications Negative for: history of anesthetic complications  Airway Mallampati: II  TM Distance: >3 FB Neck ROM: Full    Dental   Pulmonary former smoker,    Pulmonary exam normal        Cardiovascular negative cardio ROS Normal cardiovascular exam     Neuro/Psych negative neurological ROS  negative psych ROS   GI/Hepatic negative GI ROS, Neg liver ROS,   Endo/Other  negative endocrine ROS  Renal/GU negative Renal ROS     Hydrocele     Musculoskeletal negative musculoskeletal ROS (+)   Abdominal   Peds  Hematology negative hematology ROS (+)   Anesthesia Other Findings   Reproductive/Obstetrics                            Anesthesia Physical Anesthesia Plan  ASA: 2  Anesthesia Plan: General   Post-op Pain Management:    Induction:   PONV Risk Score and Plan:   Airway Management Planned: LMA  Additional Equipment:   Intra-op Plan:   Post-operative Plan:   Informed Consent: I have reviewed the patients History and Physical, chart, labs and discussed the procedure including the risks, benefits and alternatives for the proposed anesthesia with the patient or authorized representative who has indicated his/her understanding and acceptance.       Plan Discussed with: CRNA  Anesthesia Plan Comments:         Anesthesia Quick Evaluation

## 2021-03-28 NOTE — Progress Notes (Signed)
Urinalysis August 2022 also showed microhematuria.  CT urogram last week showed no significant upper tract abnormalities.  + Bilateral renal cysts without enhancement.  Left bladder diverticulum.  Cystoscopy also be performed at time of hydrocelectomy.

## 2021-03-29 NOTE — Progress Notes (Signed)
Patient here for drain removal.   He is status post excision right spermatic cord hydrocelectomy and cystoscopy on 03/28/2021.  His post-operative course was as expected and uneventful.    Constitutional:  Well nourished. Alert and oriented, No acute distress. HEENT: [place] AT, mask in place.  Trachea midline Cardiovascular: No clubbing, cyanosis, or edema. Respiratory: Normal respiratory effort, no increased work of breathing. GU: No CVA tenderness.  No bladder fullness or masses.  Patient with normal phallus. Scrotum with normal postoperative bruising and swelling.  Incision is clean and dry.  Penrose drain in, not draining any fluid.  Patient reports minimal drainage over the last 24 hours.  Drain removed successfully.  No erythema, fluctuance, crepitus or purulent drainage noted. Neurologic: Grossly intact, no focal deficits, moving all 4 extremities. Psychiatric: Normal mood and affect.   65 year old male who is status post spermatic cord hydrocelectomy and cystoscopy who presented today for Penrose drain removal.  Drain removed without incident.  He will follow-up as scheduled on April 26, 2021.  Red flag signs reviewed

## 2021-03-30 ENCOUNTER — Ambulatory Visit: Payer: Medicare HMO | Admitting: Urology

## 2021-03-30 ENCOUNTER — Encounter: Payer: Self-pay | Admitting: Urology

## 2021-03-30 ENCOUNTER — Other Ambulatory Visit: Payer: Self-pay

## 2021-03-30 VITALS — BP 131/82 | HR 85 | Ht 72.0 in | Wt 200.0 lb

## 2021-03-30 DIAGNOSIS — N433 Hydrocele, unspecified: Secondary | ICD-10-CM

## 2021-04-26 ENCOUNTER — Other Ambulatory Visit: Payer: Self-pay

## 2021-04-26 ENCOUNTER — Encounter: Payer: Self-pay | Admitting: Urology

## 2021-04-26 ENCOUNTER — Ambulatory Visit (INDEPENDENT_AMBULATORY_CARE_PROVIDER_SITE_OTHER): Payer: Medicare HMO | Admitting: Urology

## 2021-04-26 VITALS — BP 147/80 | HR 75 | Ht 72.0 in | Wt 200.0 lb

## 2021-04-26 DIAGNOSIS — R3129 Other microscopic hematuria: Secondary | ICD-10-CM

## 2021-04-26 DIAGNOSIS — N433 Hydrocele, unspecified: Secondary | ICD-10-CM

## 2021-04-26 NOTE — Progress Notes (Signed)
04/26/2021 9:39 AM   Dan Li [birth year] [id number]  Referring provider: [name], MD [phone] [facility] [address] [facility] [place],  [place] [address]  Chief Complaint  Patient presents with   Routine Post Op    HPI: 65 y.o. male presents for postop follow-up.  Status post excision of large right spermatic cord hydrocele 03/28/2021 Also noted to have microhematuria and cystoscopy was performed which was unremarkable Drain removed 03/30/2021 He had no postoperative problems and has no complaints today   PMH: Past Medical History:  Diagnosis Date   [name]    Hydrocele of testis    Inguinal hernia     Surgical History: Past Surgical History:  Procedure Laterality Date   CYSTOSCOPY N/A 03/28/2021   Procedure: CYSTOSCOPY;  Surgeon: Riki Altes, MD;  Location: [facility];  Service: Urology;  Laterality: N/A;   HYDROCELE EXCISION Right 03/02/2019   Procedure: HYDROCELECTOMY ADULT;  Surgeon: Vanna Scotland, MD;  Location: [facility];  Service: Urology;  Laterality: Right;   HYDROCELE EXCISION Right 03/28/2021   Procedure: EXCISION SPERMATIC CORD HYDROCELE;  Surgeon: Riki Altes, MD;  Location: [facility];  Service: Urology;  Laterality: Right;   INCISION AND DRAINAGE ABSCESS Right 04/16/2019   Procedure: INCISION AND DRAINAGE scrotal seroma vs hematoma;  Surgeon: Vanna Scotland, MD;  Location: [facility];  Service: Urology;  Laterality: Right;   NO PAST SURGERIES     XI ROBOTIC ASSISTED INGUINAL HERNIA REPAIR WITH MESH Bilateral 02/17/2020   Procedure: XI ROBOTIC ASSISTED INGUINAL HERNIA REPAIR WITH MESH;  Surgeon: [name], MD;  Location: [facility];  Service: General;  Laterality: Bilateral;    Home Medications:  Allergies as of 04/26/2021       Reactions   Thorazine [chlorpromazine] Anaphylaxis        Medication List        Accurate as of April 26, 2021  9:39 AM. If you have any questions, ask your nurse  or doctor.          STOP taking these medications    HYDROcodone-acetaminophen 5-325 MG tablet Commonly known as: NORCO/VICODIN Stopped by: Riki Altes, MD       TAKE these medications    olopatadine 0.1 % ophthalmic solution Commonly known as: PATANOL Place 1 drop into both eyes daily.   triamcinolone 55 MCG/ACT Aero nasal inhaler Commonly known as: NASACORT Place 2 sprays into the nose daily.        Allergies:  Allergies  Allergen Reactions   Thorazine [Chlorpromazine] Anaphylaxis    Family History: Family History  Problem Relation Age of Onset   Bladder Cancer Neg Hx    Prostate cancer Neg Hx    Kidney cancer Neg Hx     Social History:  reports that he has quit smoking. He has never used smokeless tobacco. He reports current alcohol use. He reports that he does not use drugs.   Physical Exam: BP (!) 147/80    Pulse 75    Ht 6' (1.829 m)    Wt 200 lb (90.7 kg)    BMI 27.12 kg/m   Constitutional:  Alert and oriented, No acute distress. HEENT: [place] AT, moist mucus membranes.  Trachea midline, no masses. Cardiovascular: No clubbing, cyanosis, or edema. Respiratory: Normal respiratory effort, no increased work of breathing. GI: Abdomen is soft, nontender, nondistended, no abdominal masses GU: Incision healed.  Testes descended bilateral masses or tenderness no hematoma or swelling   Assessment &  Plan:   Doing well status post excision spermatic cord hydrocele Microhematuria with negative evaluation 1 year follow-up with UA   Riki Altes, MD  [facility] [address], [address] [place], [place] [address] [phone]

## 2021-09-14 ENCOUNTER — Ambulatory Visit
Admission: RE | Admit: 2021-09-14 | Discharge: 2021-09-14 | Disposition: A | Discharge status: Home / Self Care | Payer: Medicare HMO | Source: Ambulatory Visit | Attending: Internal Medicine | Admitting: Internal Medicine

## 2021-09-14 ENCOUNTER — Other Ambulatory Visit: Payer: Self-pay | Admitting: Internal Medicine

## 2021-09-14 DIAGNOSIS — M21372 Foot drop, left foot: Secondary | ICD-10-CM

## 2021-09-14 DIAGNOSIS — M5116 Intervertebral disc disorders with radiculopathy, lumbar region: Secondary | ICD-10-CM | POA: Insufficient documentation

## 2021-11-21 ENCOUNTER — Ambulatory Visit: Payer: Self-pay | Admitting: Neurosurgery

## 2022-01-26 ENCOUNTER — Encounter: Payer: Self-pay | Admitting: Urology

## 2022-04-26 ENCOUNTER — Ambulatory Visit: Payer: Medicare HMO | Admitting: Urology

## 2022-05-02 ENCOUNTER — Ambulatory Visit: Payer: Medicare HMO | Admitting: Urology

## 2022-05-02 ENCOUNTER — Encounter: Payer: Self-pay | Admitting: Urology

## 2022-05-02 VITALS — BP 152/81 | HR 70 | Ht 72.0 in | Wt 200.0 lb

## 2022-05-02 DIAGNOSIS — N486 Induration penis plastica: Secondary | ICD-10-CM | POA: Diagnosis not present

## 2022-05-02 DIAGNOSIS — R3129 Other microscopic hematuria: Secondary | ICD-10-CM | POA: Diagnosis not present

## 2022-05-02 LAB — MICROSCOPIC EXAMINATION

## 2022-05-02 LAB — URINALYSIS, COMPLETE
Bilirubin, UA: NEGATIVE
Glucose, UA: NEGATIVE
Ketones, UA: NEGATIVE
Leukocytes,UA: NEGATIVE
Nitrite, UA: NEGATIVE
Protein,UA: NEGATIVE
RBC, UA: NEGATIVE
Specific Gravity, UA: 1.02 (ref 1.005–1.030)
Urobilinogen, Ur: 0.2 mg/dL (ref 0.2–1.0)
pH, UA: 7 (ref 5.0–7.5)

## 2022-05-02 NOTE — Progress Notes (Signed)
05/02/2022 10:08 AM   Dan Li Nov 23, 1955 [id number]  Referring provider: Rusty Aus, MD [facility] [place],  [place] [address]  Chief Complaint  Patient presents with   Follow-up    HPI: 67 y.o. male presents for follow-up.  Status post excision of large right spermatic cord hydrocele 03/28/2021 Has had no problems postoperatively and no recurrent swelling Approximately 1 year ago he has noted penile curvature with erections.  Curvature noted to the right between the base/mid shaft and estimated at 20-30 degrees.  Able to have successful intercourse.  No pain with erections.  The curvature has been stable   PMH: Past Medical History:  Diagnosis Date   [name]    Hydrocele of testis    Inguinal hernia     Surgical History: Past Surgical History:  Procedure Laterality Date   CYSTOSCOPY N/A 03/28/2021   Procedure: CYSTOSCOPY;  Surgeon: Abbie Sons, MD;  Location: [facility];  Service: Urology;  Laterality: N/A;   HYDROCELE EXCISION Right 03/02/2019   Procedure: HYDROCELECTOMY ADULT;  Surgeon: Hollice Espy, MD;  Location: [facility];  Service: Urology;  Laterality: Right;   HYDROCELE EXCISION Right 03/28/2021   Procedure: EXCISION SPERMATIC CORD HYDROCELE;  Surgeon: Abbie Sons, MD;  Location: [facility];  Service: Urology;  Laterality: Right;   INCISION AND DRAINAGE ABSCESS Right 04/16/2019   Procedure: INCISION AND DRAINAGE scrotal seroma vs hematoma;  Surgeon: Hollice Espy, MD;  Location: [facility];  Service: Urology;  Laterality: Right;   NO PAST SURGERIES     XI ROBOTIC ASSISTED INGUINAL HERNIA REPAIR WITH MESH Bilateral 02/17/2020   Procedure: XI ROBOTIC ASSISTED INGUINAL HERNIA REPAIR WITH MESH;  Surgeon: Ronny Bacon, MD;  Location: [facility];  Service: General;  Laterality: Bilateral;    Home Medications:  Allergies as of 05/02/2022       Reactions   Thorazine [chlorpromazine]  Anaphylaxis        Medication List        Accurate as of May 02, 2022 10:08 AM. If you have any questions, ask your nurse or doctor.          olopatadine 0.1 % ophthalmic solution Commonly known as: PATANOL Place 1 drop into both eyes daily.   triamcinolone 55 MCG/ACT Aero nasal inhaler Commonly known as: NASACORT Place 2 sprays into the nose daily.        Allergies:  Allergies  Allergen Reactions   Thorazine [Chlorpromazine] Anaphylaxis    Family History: Family History  Problem Relation Age of Onset   Bladder Cancer Neg Hx    Prostate cancer Neg Hx    Kidney cancer Neg Hx     Social History:  reports that he has quit smoking. He has never used smokeless tobacco. He reports current alcohol use. He reports that he does not use drugs.   Physical Exam: BP (!) 152/81   Pulse 70   Ht 6' (1.829 m)   Wt 200 lb (90.7 kg)   BMI 27.12 kg/m   Constitutional:  Alert and oriented, No acute distress. HEENT: [place] AT, moist mucus membranes.  Trachea midline, no masses. Cardiovascular: No clubbing, cyanosis, or edema. Respiratory: Normal respiratory effort, no increased work of breathing. GI: Abdomen is soft, nontender, nondistended, no abdominal masses GU: Stretch penile length normal.  ~ 1 cm lateral plaque right corpus towards the base.  Testes descended bilaterally.  Assessment & Plan:    1.  Peyronie's disease He  was given a brochure on Xiaflex which was discussed in detail Presently his symptoms are not bothersome.  He will call back if symptoms worsen  2.  Status posttraumatic or hydrocelectomy Doing well  3.  Microhematuria UA today negative blood on dipstick and microscopy   Abbie Sons, MD  [place] [address], [place], [place] [address] [phone]

## 2023-03-24 IMAGING — MR MR LUMBAR SPINE W/O CM
5 series · 31 of 48 positions shown · non-contrast
Comparison: CT abdomen and pelvis 03/21/2021

CLINICAL DATA: Lumbar disc disease with radiculopathy. Left foot
drop and left leg numbness.

EXAM:
MRI LUMBAR SPINE WITHOUT CONTRAST
TECHNIQUE: Multiplanar, multisequence MR imaging of the lumbar spine was
performed. No intravenous contrast was administered.

[Series 5: T2 · sagittal · 4.0mm · 0.81mm/px · 6 of 17 slices shown (1 of 2)]
[im 1/17]
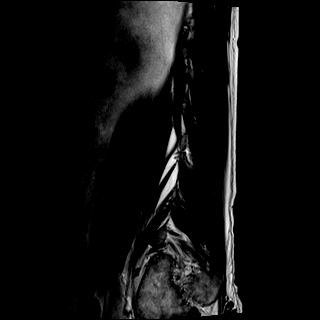
[im 4/17]
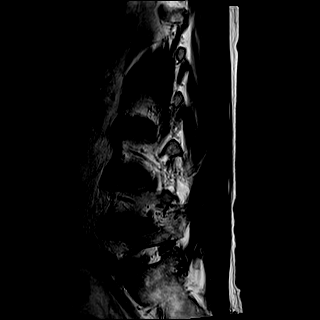
[im 7/17]
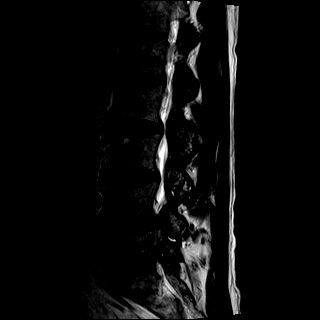
[im 10/17]
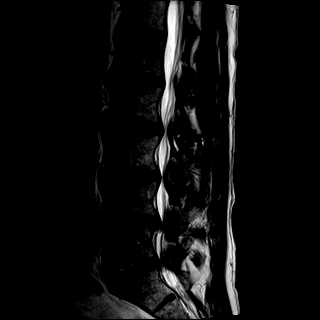
[im 13/17]
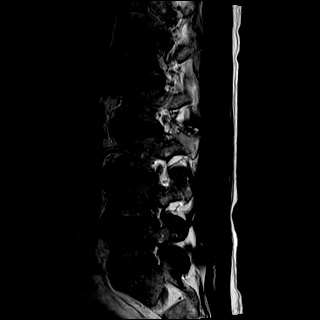
[im 17/17]
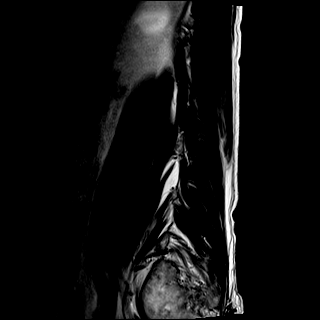

[Series 6: T1 · sagittal · 4.0mm · 0.81mm/px · 6 of 17 slices shown (1 of 2)]
[im 1/17]
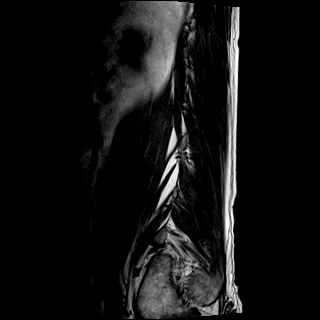
[im 4/17]
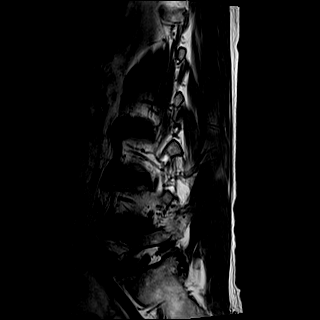
[im 7/17]
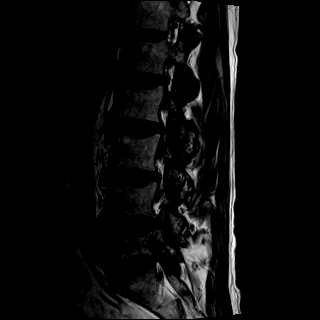
[im 10/17]
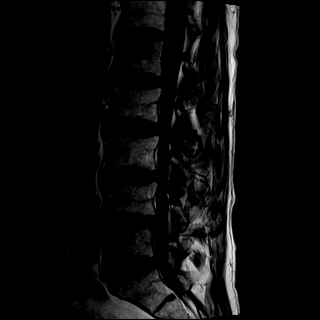
[im 13/17]
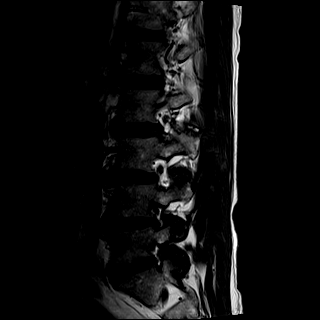
[im 17/17]
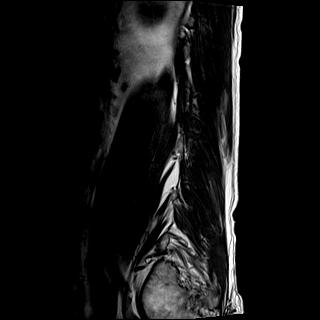

[Series 7: STIR · sagittal · 4.0mm · 0.41mm/px · 1 of 17 slices shown]
[im 1/17]
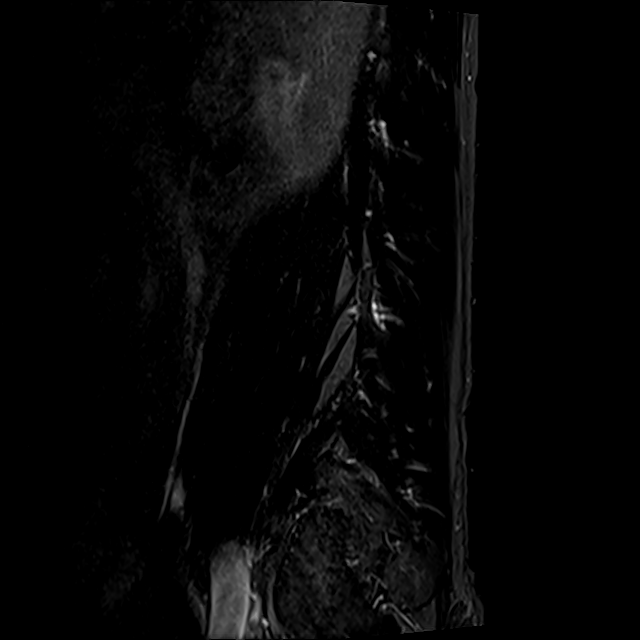

[Series 8: T2 · axial · 4.0mm · 0.78mm/px · z∈[-127,+108]mm · 9 of 42 slices shown (2 of 2)]
[im 1/42]
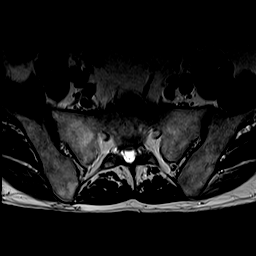
[im 6/42]
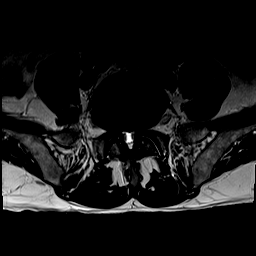
[im 12/42]
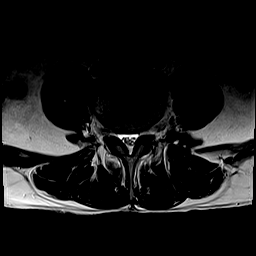
[im 18/42]
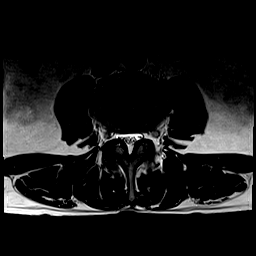
[im 21/42]
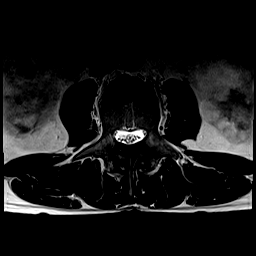
[im 24/42]
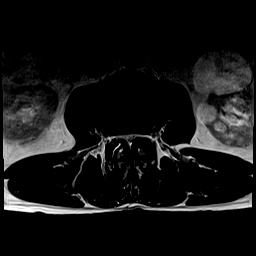
[im 30/42]
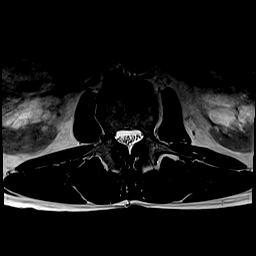
[im 36/42]
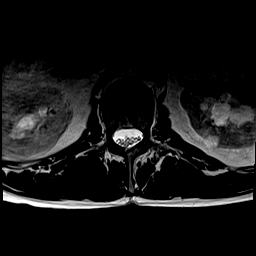
[im 42/42]
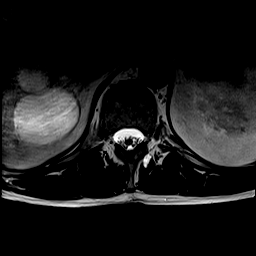

[Series 9: T1 · axial · 4.0mm · 0.39mm/px · z∈[-127,+108]mm · 9 of 42 slices shown (2 of 2)]
[im 1/42]
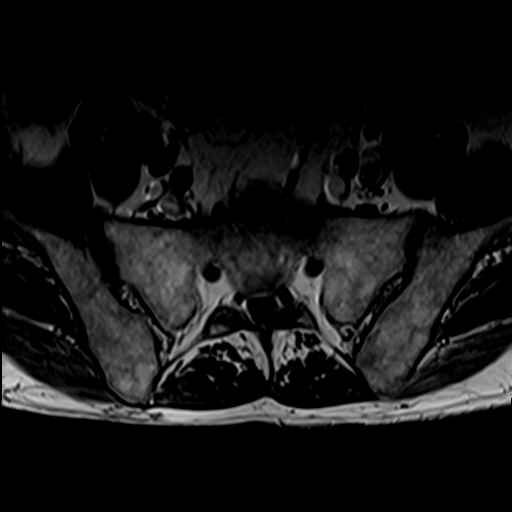
[im 6/42]
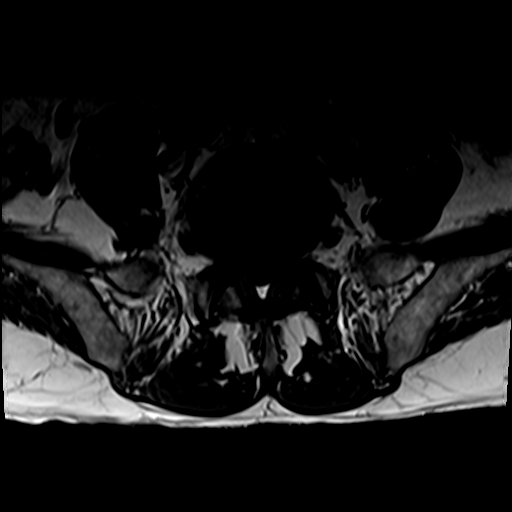
[im 12/42]
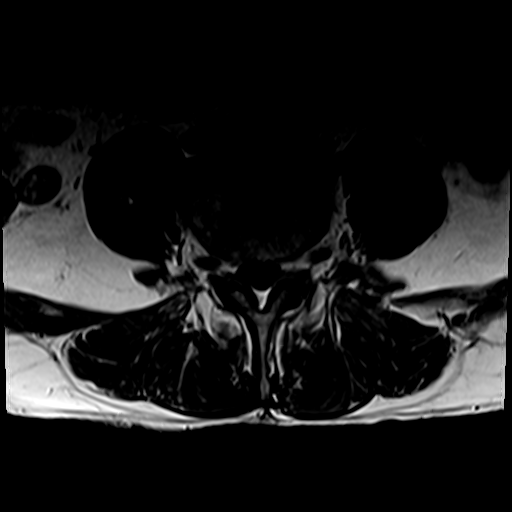
[im 18/42]
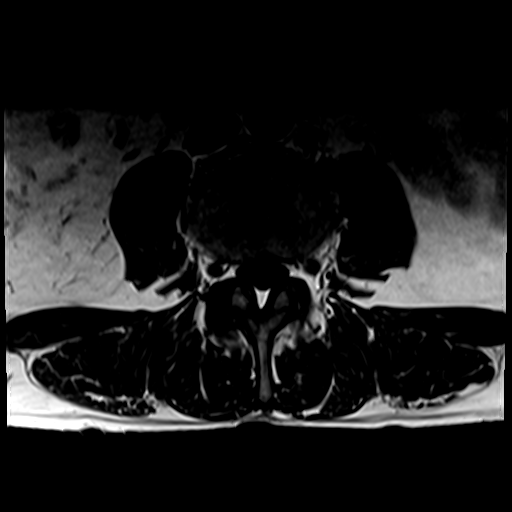
[im 21/42]
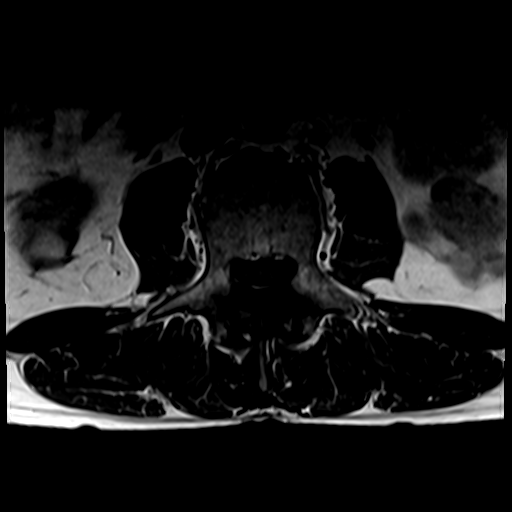
[im 24/42]
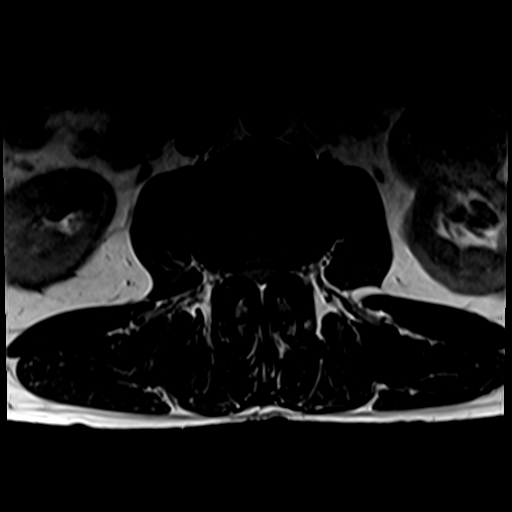
[im 30/42]
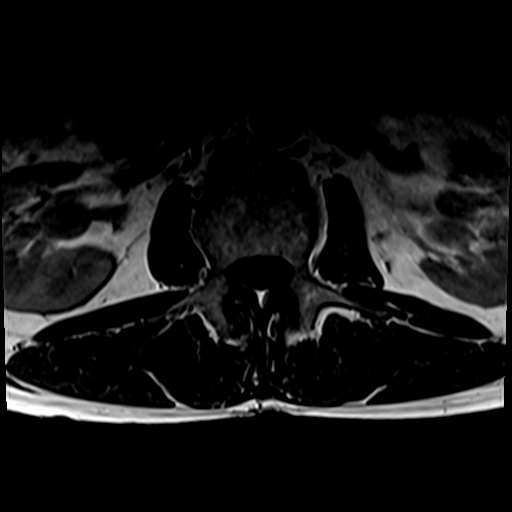
[im 36/42]
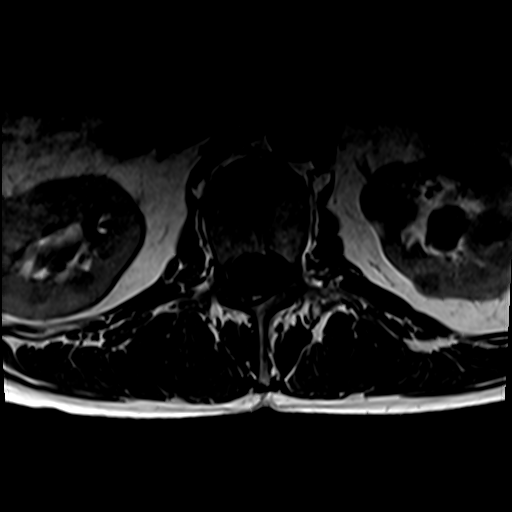
[im 42/42]
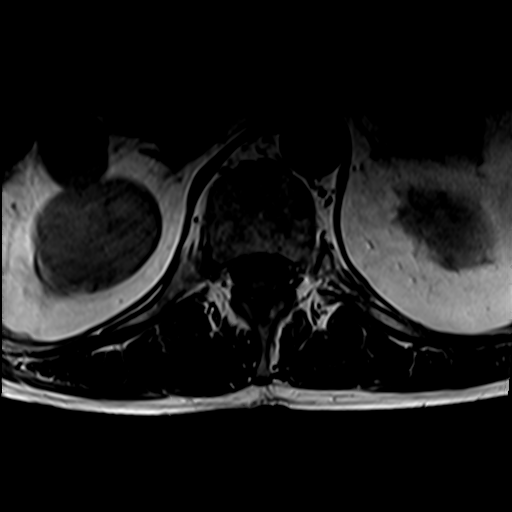

[31 of 48 positions shown; findings below may reference images not displayed]

FINDINGS: Segmentation:  Standard.

Alignment: Straightening of the normal lumbar lordosis. Unchanged
trace anterolisthesis of L3 on L4 and trace retrolisthesis of L4 on
L5.

Vertebrae: No fracture, suspicious marrow lesion, or significant
marrow edema.

Conus medullaris and cauda equina: Conus extends to the T12-L1
level. Conus and cauda equina appear normal.

Paraspinal and other soft tissues: Partially visualized bilateral
renal cysts as shown on the prior CT.

Disc levels:

Disc desiccation greatest at L3-4 and L4-5 where there is mild disc
space narrowing. Diffuse congenital narrowing of the lumbar spinal
canal due to short pedicles.

T12-L1: Negative.

L1-2: Minimal disc bulging and moderate facet hypertrophy result in
mild spinal stenosis without neural foraminal stenosis.

L2-3: Disc bulging and severe facet hypertrophy result in moderate
to severe spinal stenosis without neural foraminal stenosis.

L3-4: Disc bulging and severe facet hypertrophy result in moderate
spinal stenosis and asymmetric right lateral recess stenosis without
neural foraminal stenosis.

L4-5: Disc bulging, a large left paracentral and subarticular disc
extrusion with caudal migration to the inferior L5 level, and
moderate facet hypertrophy result in severe spinal stenosis and
asymmetrically severe left lateral recess stenosis with potential
impingement of multiple nerve roots, most notably left L5. Mild
bilateral neural foraminal stenosis.

L5-S1: Minimal disc bulging and moderate facet and ligamentum flavum
hypertrophy result in mild spinal stenosis and moderate to severe
bilateral lateral recess stenosis without significant neural
foraminal stenosis.
IMPRESSION: 1. Large disc extrusion at L4-5 with severe spinal and left lateral
recess stenosis.
2. Moderate to severe spinal stenosis at L2-3 and moderate spinal
stenosis at L3-4.
3. Moderate to severe lateral recess stenosis at L5-S1.

## 2023-05-27 ENCOUNTER — Other Ambulatory Visit: Payer: Self-pay | Admitting: Internal Medicine

## 2023-05-27 DIAGNOSIS — R972 Elevated prostate specific antigen [PSA]: Secondary | ICD-10-CM

## 2023-05-30 ENCOUNTER — Ambulatory Visit
Admission: RE | Admit: 2023-05-30 | Discharge: 2023-05-30 | Disposition: A | Discharge status: Home / Self Care | Payer: Medicare HMO | Source: Ambulatory Visit | Attending: Internal Medicine | Admitting: Internal Medicine

## 2023-05-30 DIAGNOSIS — R972 Elevated prostate specific antigen [PSA]: Secondary | ICD-10-CM | POA: Diagnosis present

## 2023-05-30 MED ORDER — GADOBUTROL 1 MMOL/ML IV SOLN
9.0000 mL | Freq: Once | INTRAVENOUS | Status: AC | PRN
  Administered 2023-05-30: 9 mL via INTRAVENOUS

## 2023-07-15 ENCOUNTER — Encounter: Payer: Self-pay | Admitting: Radiation Oncology

## 2023-07-15 NOTE — Progress Notes (Signed)
 GU Location of Tumor / Histology: Prostate Ca  If Prostate Cancer, Gleason Score is (3 + 4) and PSA is (4.16 on 05/22/2023)  Dan Li presented as referral from Dr. Traci Sermon [facility]) elevated PSA.  Biopsies     05/30/2023 Dr. Bethann Punches MR Prostate with/without Contrast CLINICAL DATA:  Elevated PSA level. R97.20   IMPRESSION: 1. PI-RADS category 4 lesion of the right posteromedial peripheral zone in the apex. 2. PI-RADS category 3 lesion of the left posteromedial peripheral zone in the mid gland. 3. INSERT target 4. Prostatomegaly and benign prostatic hypertrophy. 5. Sigmoid colon diverticulosis. 6. 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    Past/Anticipated interventions by urology, if any: NA  Past/Anticipated interventions by medical oncology, if any: NA  Weight changes, if any:  No  IPSS:  11 SHIM:  25  Bowel/Bladder complaints, if any:  No  Nausea/Vomiting, if any: No  Pain issues, if any:  No  SAFETY ISSUES: Prior radiation?  No Pacemaker/ICD? No Possible current pregnancy? Male Is the patient on methotrexate? No  Current Complaints / other details:

## 2023-07-24 NOTE — Progress Notes (Signed)
 Radiation Oncology         [phone] ________________________________  Initial Outpatient Consultation  Name: Dan Li MRN: [id number]  Date: 07/26/2023  DOB: 1955-10-19  WU:[id number], Dan Negus, MD  Despina Arias, MD   REFERRING PHYSICIAN: Despina Arias, MD  DIAGNOSIS: 68 y.o. gentleman with Stage T1c adenocarcinoma of the prostate with Gleason score of 3+4, and PSA of 4.16.  No diagnosis found.  HISTORY OF PRESENT ILLNESS: Dan Li is a 68 y.o. male with a diagnosis of prostate cancer. He was noted to have an elevated PSA of 4.16 by his primary care physician, Dr. Hyacinth Li. He underwent prostate MRI on 05/30/23 showing: PI-RADS 4 lesion of right posteromedial peripheral zone in apex; PI-RADS 3 lesion of left posteromedial peripheral zone in mid gland. Accordingly, he was referred for evaluation in urology by Dr. Lafonda Li on 06/13/23. The patient proceeded to MRI fusion biopsy of the prostate on 07/04/23.  The prostate volume measured 75 cc.  Out of 18 core biopsies, only 1 was positive.  The maximum Gleason score was 3+4, and this was seen in 5% of one core from the left ROI.  The patient reviewed the biopsy results with his urologist and he has kindly been referred today for discussion of potential radiation treatment options.   PREVIOUS RADIATION THERAPY: No  PAST MEDICAL HISTORY:  Past Medical History:  Diagnosis Date   Depression    Elevated PSA    [name]    Hydrocele of testis    Inguinal hernia       PAST SURGICAL HISTORY: Past Surgical History:  Procedure Laterality Date   CYSTOSCOPY N/A 03/28/2021   Procedure: CYSTOSCOPY;  Surgeon: Riki Altes, MD;  Location: [facility];  Service: Urology;  Laterality: N/A;   HYDROCELE EXCISION Right 03/02/2019   Procedure: HYDROCELECTOMY ADULT;  Surgeon: Vanna Scotland, MD;  Location: [facility];  Service: Urology;  Laterality: Right;   HYDROCELE EXCISION Right 03/28/2021   Procedure: EXCISION SPERMATIC  CORD HYDROCELE;  Surgeon: Riki Altes, MD;  Location: [facility];  Service: Urology;  Laterality: Right;   INCISION AND DRAINAGE ABSCESS Right 04/16/2019   Procedure: INCISION AND DRAINAGE scrotal seroma vs hematoma;  Surgeon: Vanna Scotland, MD;  Location: [facility];  Service: Urology;  Laterality: Right;   NO PAST SURGERIES     PROSTATE BIOPSY     XI ROBOTIC ASSISTED INGUINAL HERNIA REPAIR WITH MESH Bilateral 02/17/2020   Procedure: XI ROBOTIC ASSISTED INGUINAL HERNIA REPAIR WITH MESH;  Surgeon: [name], MD;  Location: [facility];  Service: General;  Laterality: Bilateral;    FAMILY HISTORY:  Family History  Problem Relation Age of Onset   Bladder Cancer Neg Hx    Prostate cancer Neg Hx    Kidney cancer Neg Hx     SOCIAL HISTORY:  Social History   Socioeconomic History   Marital status: Married    Spouse name: Not on file   Number of children: Not on file   Years of education: Not on file   Highest education level: Not on file  Occupational History   Not on file  Tobacco Use   Smoking status: Former   Smokeless tobacco: Never  Vaping Use   Vaping status: Never Used  Substance and Sexual Activity   Alcohol use: Yes    Alcohol/week: 0.0 standard drinks of alcohol    Comment: very rare   Drug use: No   Sexual activity: Not on file  Other  Topics Concern   Not on file  Social History Narrative   Not on file   Social Drivers of Health   Financial Resource Strain: Not on file  Food Insecurity: Not on file  Transportation Needs: Not on file  Physical Activity: Not on file  Stress: Not on file  Social Connections: Not on file  Intimate Partner Violence: Not on file    ALLERGIES: Thorazine [chlorpromazine]  MEDICATIONS:  Current Outpatient Medications  Medication Sig Dispense Refill   olopatadine (PATANOL) 0.1 % ophthalmic solution Place 1 drop into both eyes daily.     triamcinolone (NASACORT) 55 MCG/ACT AERO nasal inhaler Place 2 sprays into the nose  daily.     No current facility-administered medications for this encounter.    REVIEW OF SYSTEMS:  On review of systems, the patient reports that he is doing well overall. He denies any chest pain, shortness of breath, cough, fevers, chills, night sweats, unintended weight changes. He denies any bowel disturbances, and denies abdominal pain, nausea or vomiting. He denies any new musculoskeletal or joint aches or pains. His IPSS was ***, indicating *** urinary symptoms. His SHIM was ***, indicating he {does not have/has mild/moderate/severe} erectile dysfunction. A complete review of systems is obtained and is otherwise negative.    PHYSICAL EXAM:  Wt Readings from Last 3 Encounters:  05/02/22 200 lb (90.7 kg)  04/26/21 200 lb (90.7 kg)  03/30/21 200 lb (90.7 kg)   Temp Readings from Last 3 Encounters:  03/28/21 (!) 97 F (36.1 C) (Temporal)  03/03/20 97.7 F (36.5 C)  02/17/20 97.7 F (36.5 C) (Temporal)   BP Readings from Last 3 Encounters:  05/02/22 (!) 152/81  04/26/21 (!) 147/80  03/30/21 131/82   Pulse Readings from Last 3 Encounters:  05/02/22 70  04/26/21 75  03/30/21 85    /10  In general this is a well appearing *** male in no acute distress. He's alert and oriented x4 and appropriate throughout the examination. Cardiopulmonary assessment is negative for acute distress, and he exhibits normal effort.     KPS = ***  100 - Normal; no complaints; no evidence of disease. 90   - Able to carry on normal activity; minor signs or symptoms of disease. 80   - Normal activity with effort; some signs or symptoms of disease. 65   - Cares for self; unable to carry on normal activity or to do active work. 60   - Requires occasional assistance, but is able to care for most of his personal needs. 50   - Requires considerable assistance and frequent medical care. 40   - Disabled; requires special care and assistance. 30   - Severely disabled; hospital admission is indicated  although death not imminent. 20   - Very sick; hospital admission necessary; active supportive treatment necessary. 10   - Moribund; fatal processes progressing rapidly. 0     - Dead  Karnofsky DA, Abelmann WH, Craver LS and Burchenal [facility] [phone]) The use of the nitrogen mustards in the palliative treatment of carcinoma: with particular reference to bronchogenic carcinoma Cancer 1 634-56  LABORATORY DATA:  Lab Results  Component Value Date   WBC 4.3 02/16/2020   HGB 15.7 02/16/2020   HCT 46.2 02/16/2020   MCV 83.2 02/16/2020   PLT 182 02/16/2020   Lab Results  Component Value Date   NA 140 02/16/2020   K 3.9 02/16/2020   CL 107 02/16/2020   CO2 23 02/16/2020   Lab Results  Component Value Date   ALT 25 02/16/2020   AST 24 02/16/2020   ALKPHOS 65 02/16/2020   BILITOT 0.9 02/16/2020     RADIOGRAPHY: No results found.    IMPRESSION/PLAN: 1. 68 y.o. gentleman with Stage T1c adenocarcinoma of the prostate with Gleason Score of 3+4, and PSA of 4.16. We discussed the patient's workup and outlined the nature of prostate cancer in this setting. The patient's T stage, Gleason's score, and PSA put him into the favorable intermediate risk group. Accordingly, he is eligible for a variety of potential treatment options including brachytherapy, 5.5 weeks of external radiation, or prostatectomy. We discussed the available radiation techniques, and focused on the details and logistics of delivery. The patient may not be an ideal candidate for brachytherapy with a prostate volume of 75 cc{ prior to downsizing from hormone therapy. We discussed that based on his prostate volume, he would require beginning treatment with a 5 alpha reductase inhibitor +/- ADT for at least 3 months to allow for downsizing of the prostate prior to initiating brachytherapy}. We discussed and outlined the risks, benefits, short and long-term effects associated with radiotherapy and compared and contrasted these with  prostatectomy. We discussed the role of SpaceOAR gel in reducing the rectal toxicity associated with radiotherapy. He appears to have a good understanding of his disease and our treatment recommendations which are of curative intent.  He was encouraged to ask questions that were answered to his stated satisfaction.  At the conclusion of our conversation, the patient is interested in moving forward with ***.  We personally spent *** minutes in this encounter including chart review, reviewing radiological studies, meeting face-to-face with the patient, entering orders and completing documentation.    Marguarite Arbour, PA-C    Margaretmary Dys, MD  [facility]  Radiation Oncology Direct Dial: [phone]  Fax: [phone] [email or web address]  Skype  [email or web address]   This document serves as a record of services personally performed by Margaretmary Dys, MD and Marcello Fennel, PA-C. It was created on their behalf by Mickie Bail, a trained medical scribe. The creation of this record is based on the scribe's personal observations and the provider's statements to them. This document has been checked and approved by the attending provider.

## 2023-07-25 DIAGNOSIS — C61 Malignant neoplasm of prostate: Secondary | ICD-10-CM | POA: Insufficient documentation

## 2023-07-26 ENCOUNTER — Ambulatory Visit
Admission: RE | Admit: 2023-07-26 | Discharge: 2023-07-26 | Disposition: A | Discharge status: Home / Self Care | Source: Ambulatory Visit | Attending: Radiation Oncology | Admitting: Radiation Oncology

## 2023-07-26 ENCOUNTER — Encounter: Payer: Self-pay | Admitting: Radiation Oncology

## 2023-07-26 VITALS — BP 128/72 | HR 60 | Temp 97.3°F | Resp 18 | Ht 72.0 in | Wt 197.4 lb

## 2023-07-26 DIAGNOSIS — K449 Diaphragmatic hernia without obstruction or gangrene: Secondary | ICD-10-CM | POA: Insufficient documentation

## 2023-07-26 DIAGNOSIS — N433 Hydrocele, unspecified: Secondary | ICD-10-CM | POA: Insufficient documentation

## 2023-07-26 DIAGNOSIS — Z87891 Personal history of nicotine dependence: Secondary | ICD-10-CM | POA: Diagnosis not present

## 2023-07-26 DIAGNOSIS — C61 Malignant neoplasm of prostate: Secondary | ICD-10-CM | POA: Insufficient documentation

## 2023-07-26 DIAGNOSIS — B009 Herpesviral infection, unspecified: Secondary | ICD-10-CM | POA: Insufficient documentation

## 2023-07-26 DIAGNOSIS — Z79899 Other long term (current) drug therapy: Secondary | ICD-10-CM | POA: Insufficient documentation

## 2023-07-26 HISTORY — DX: Depression, unspecified: F32.A

## 2023-07-26 HISTORY — DX: Elevated prostate specific antigen (PSA): R97.20

## 2023-07-26 NOTE — Progress Notes (Signed)
 Introduced myself to the patient, and his wife, as the prostate nurse navigator.  He is here to discuss his radiation treatment options and is still pending a surgical consult at [facility].  I gave him my business card and asked him to call me with questions or concerns.  Verbalized understanding.

## 2023-07-30 NOTE — Progress Notes (Signed)
 RN contacted [facility] to follow up with surgical consult being scheduled.  Patient is now scheduled for consult with Dr. Laverle Patter on 4/29 at 9:30am.   RN spoke with patient and provided appointment information.  Contact number provided if any conflicts.  No additional needs at this time.

## 2023-08-27 NOTE — Progress Notes (Signed)
 Patient is now scheduled for surgical consult with Dr. Rozanne Corners on 5/2.  RN will follow up after consult to ensure final treatment decision.

## 2023-09-09 NOTE — Progress Notes (Signed)
 Patient is now scheduled for surgical consult with Dr. Rozanne Corners on 6/3.

## 2023-10-10 NOTE — Progress Notes (Signed)
 Patient proceed with surgical consult with Dr. Rozanne Corners on 6/4.   RN left message for follow up to ensure he had my direct number for any follow up questions or treatment decision.

## 2023-11-12 NOTE — Progress Notes (Signed)
 Patient met with Dr. Renda on 6/4 and is now scheduled for repeat PSA 01/01/24.  Patient has my contact information for any questions that may arise, or decision to move forward with radiation treatment.

## 2024-01-17 ENCOUNTER — Other Ambulatory Visit: Payer: Self-pay | Admitting: Urology

## 2024-01-17 DIAGNOSIS — C61 Malignant neoplasm of prostate: Secondary | ICD-10-CM

## 2024-01-20 ENCOUNTER — Encounter: Payer: Self-pay | Admitting: Urology

## 2024-03-23 ENCOUNTER — Ambulatory Visit
Admission: RE | Admit: 2024-03-23 | Discharge: 2024-03-23 | Disposition: A | Discharge status: Home / Self Care | Source: Ambulatory Visit | Attending: Urology | Admitting: Urology

## 2024-03-23 DIAGNOSIS — C61 Malignant neoplasm of prostate: Secondary | ICD-10-CM

## 2024-03-23 MED ORDER — GADOPICLENOL 0.5 MMOL/ML IV SOLN
10.0000 mL | Freq: Once | INTRAVENOUS | Status: AC | PRN
  Administered 2024-03-23: 9 mL via INTRAVENOUS
